# Patient Record
Sex: Female | Born: 1965
Health system: Southern US, Community
[De-identification: ages and names within clinical notes are randomized; demographics above are authoritative.]

## PROBLEM LIST (undated history)

## (undated) DIAGNOSIS — E669 Obesity, unspecified: Secondary | ICD-10-CM

## (undated) DIAGNOSIS — J45909 Unspecified asthma, uncomplicated: Secondary | ICD-10-CM

## (undated) DIAGNOSIS — E785 Hyperlipidemia, unspecified: Secondary | ICD-10-CM

## (undated) DIAGNOSIS — Z8709 Personal history of other diseases of the respiratory system: Secondary | ICD-10-CM

## (undated) DIAGNOSIS — I10 Essential (primary) hypertension: Secondary | ICD-10-CM

## (undated) DIAGNOSIS — E119 Type 2 diabetes mellitus without complications: Secondary | ICD-10-CM

## (undated) DIAGNOSIS — M1990 Unspecified osteoarthritis, unspecified site: Secondary | ICD-10-CM

## (undated) DIAGNOSIS — K219 Gastro-esophageal reflux disease without esophagitis: Secondary | ICD-10-CM

## (undated) DIAGNOSIS — D649 Anemia, unspecified: Secondary | ICD-10-CM

## (undated) HISTORY — DX: Unspecified asthma, uncomplicated: J45.909

## (undated) HISTORY — PX: CHOLECYSTECTOMY: SHX55

## (undated) HISTORY — PX: KNEE SURGERY: SHX244

## (undated) HISTORY — PX: SALPINGOOPHORECTOMY: SHX82

---

## 1998-11-04 HISTORY — PX: KNEE CARTILAGE SURGERY: SHX688

## 2016-01-19 ENCOUNTER — Emergency Department
Admission: EM | Admit: 2016-01-19 | Discharge: 2016-01-19 | Disposition: A | Discharge status: Home / Self Care | Payer: BLUE CROSS/BLUE SHIELD | Attending: Emergency Medicine | Admitting: Emergency Medicine

## 2016-01-19 DIAGNOSIS — J111 Influenza due to unidentified influenza virus with other respiratory manifestations: Secondary | ICD-10-CM | POA: Insufficient documentation

## 2016-01-19 LAB — RAPID INFLUENZA A&B ANTIGENS
Influenza A (ARMC): NEGATIVE
Influenza B (ARMC): NEGATIVE

## 2016-01-19 MED ORDER — AZITHROMYCIN 250 MG PO TABS: ORAL_TABLET | ORAL | Status: DC

## 2016-01-19 NOTE — ED Notes (Signed)
Patient reports flu like symptoms since Monday.  Patient awake, alert skin warm and dry, wnl.  No acute distress noted.

## 2016-01-19 NOTE — Discharge Instructions (Signed)

## 2016-01-20 NOTE — ED Provider Notes (Signed)
[facility] Emergency Department Provider Note ____________________________________________  Time seen: 2220  I have reviewed the triage vital signs and the nursing notes.  HISTORY  Chief Complaint  Influenza  HPI Kristin Cherry is a 50 y.o. female presents to the ED with her 59 year old son, both with complaints of flulike symptoms since Monday. Patient denies receiving the seasonal flu vaccine. She reports subjective fevers, cough, and congestion. She's been dosing Alka-Seltzer cough & cold without significant benefit to her symptoms.She rates her overall discomfort 5/10 in triage.  No past medical history on file.  There are no active problems to display for this patient.   No past surgical history on file.  Current Outpatient Rx  Name  Route  Sig  Dispense  Refill  . azithromycin (ZITHROMAX Z-PAK) 250 MG tablet      Take 2 tablets (500 mg) on  Day 1,  followed by 1 tablet (250 mg) once daily on Days 2 through 5.   6 each   0    Allergies Lodine and Penicillins  No family history on file.  Social History Social History  Substance Use Topics  . Smoking status: Not on file  . Smokeless tobacco: Not on file  . Alcohol Use: Not on file   Review of Systems  Constitutional: Positive for fever. Eyes: Negative for visual changes. ENT: Negative for sore throat. Cardiovascular: Negative for chest pain. Respiratory: Negative for shortness of breath. Reports intermittent cough. Gastrointestinal: Negative for abdominal pain, vomiting and diarrhea. Musculoskeletal: Negative for back pain. Skin: Negative for rash. Neurological: Negative for headaches, focal weakness or numbness. ____________________________________________  PHYSICAL EXAM:  VITAL SIGNS: ED Triage Vitals  Enc Vitals Group     BP 01/19/16 1946 171/93 mmHg     Pulse Rate 01/19/16 1946 67     Resp 01/19/16 1946 20     Temp 01/19/16 1946 98.1 F (36.7 C)     Temp Source 01/19/16  1946 Oral     SpO2 01/19/16 1946 99 %     Weight 01/19/16 1946 238 lb (107.956 kg)     Height 01/19/16 1946 5\' 6"  (1.676 m)     Head Cir --      Peak Flow --      Pain Score 01/19/16 1947 5     Pain Loc --      Pain Edu? --      Excl. in [place]? --    Constitutional: Alert and oriented. Well appearing and in no distress. Head: Normocephalic and atraumatic.      Eyes: Conjunctivae are normal. PERRL. Normal extraocular movements      Ears: Canals clear. TMs intact bilaterally.   Nose: No congestion/rhinorrhea.   Mouth/Throat: Mucous membranes are moist.   Neck: Supple. No thyromegaly. Hematological/Lymphatic/Immunological: No cervical lymphadenopathy. Cardiovascular: Normal rate, regular rhythm.  Respiratory: Normal respiratory effort. No wheezes/rales/rhonchi. Gastrointestinal: Soft and nontender. No distention. Musculoskeletal: Nontender with normal range of motion in all extremities.  Neurologic:  Normal gait without ataxia. Normal speech and language. No gross focal neurologic deficits are appreciated. Skin:  Skin is warm, dry and intact. No rash noted. Psychiatric: Mood and affect are normal. Patient exhibits appropriate insight and judgment. ____________________________________________   LABS (pertinent positives/negatives) Labs Reviewed  RAPID INFLUENZA A&B ANTIGENS ([place])  ____________________________________________  INITIAL IMPRESSION / ASSESSMENT AND PLAN / ED COURSE  Patient with a negative rapid flu test on evaluation today. She is presumed to have an influenza-like illness considering confirmation in her  son. She will be discharged with azithromycin for prophylactic treatment of a postviral pneumonia. She is encouraged to continue to monitor symptoms and treat fevers and symptoms as appropriate with over-the-counter medications. She will follow with primary care provider or [facility] as needed for ongoing symptom management. Return to the ED for acutely worsening  respiratory symptoms. ____________________________________________  FINAL CLINICAL IMPRESSION(S) / ED DIAGNOSES  Final diagnoses:  Influenza      Melvenia Needles, PA-C 01/20/16 0120  Orbie Pyo, MD 01/21/16 [phone]

## 2016-04-10 ENCOUNTER — Other Ambulatory Visit: Payer: Self-pay | Admitting: Nurse Practitioner

## 2016-04-10 DIAGNOSIS — Z1239 Encounter for other screening for malignant neoplasm of breast: Secondary | ICD-10-CM

## 2016-07-21 ENCOUNTER — Encounter: Payer: Self-pay | Admitting: Gynecology

## 2016-07-21 ENCOUNTER — Ambulatory Visit
Admission: EM | Admit: 2016-07-21 | Discharge: 2016-07-21 | Disposition: A | Discharge status: Home / Self Care | Payer: BLUE CROSS/BLUE SHIELD | Attending: Family Medicine | Admitting: Family Medicine

## 2016-07-21 DIAGNOSIS — J40 Bronchitis, not specified as acute or chronic: Secondary | ICD-10-CM | POA: Diagnosis not present

## 2016-07-21 HISTORY — DX: Essential (primary) hypertension: I10

## 2016-07-21 HISTORY — DX: Personal history of other diseases of the respiratory system: Z87.09

## 2016-07-21 HISTORY — DX: Type 2 diabetes mellitus without complications: E11.9

## 2016-07-21 MED ORDER — PREDNISONE 50 MG PO TABS
ORAL_TABLET | ORAL | 0 refills | Status: DC
Start: 2016-07-21 — End: 2017-05-09

## 2016-07-21 MED ORDER — DOXYCYCLINE HYCLATE 100 MG PO CAPS: 100.0000 mg | ORAL_CAPSULE | Freq: Two times a day (BID) | ORAL | 0 refills | Status: DC

## 2016-07-21 MED ORDER — HYDROCOD POLST-CPM POLST ER 10-8 MG/5ML PO SUER: 5.0000 mL | Freq: Two times a day (BID) | ORAL | 0 refills | Status: DC | PRN

## 2016-07-21 NOTE — ED Provider Notes (Signed)
[facility]    CSN: [id number] Arrival date & time: 07/21/16  1314  First Provider Contact:  First MD Initiated Contact with Patient 07/21/16 1351     History   Chief Complaint Chief Complaint  Patient presents with  . Cough  . Shortness of Breath   HPI  50 year old female with diabetes, hypertension, history of bronchitis per her report, tobacco abuse presents with complaints of cough, and shortness of breath.  Patient states she's been sick for the past 3 days. She has been experiencing severe cough and congestion. She reports associated shortness of breath. She also reports associated chest tightness. No known exacerbating or relieving factors. No associated fever or chills. Patient endorses a history of bronchitis. She also continues to smoke. Her history includes to likely underlying chronic bronchitis.  Past Medical History:  Diagnosis Date  . Diabetes mellitus without complication ([place])   . History of bronchitis   . Hypertension    Past Surgical History:  Procedure Laterality Date  . CHOLECYSTECTOMY    . KNEE SURGERY    . SALPINGOOPHORECTOMY Right    OB History    No data available     Home Medications    Prior to Admission medications   Medication Sig Start Date End Date Taking? Authorizing Provider  albuterol (PROVENTIL) (2.5 MG/3ML) 0.083% nebulizer solution Take 2.5 mg by nebulization every 6 (six) hours as needed for wheezing or shortness of breath.   Yes Historical Provider, MD  lisinopril (PRINIVIL,ZESTRIL) 10 MG tablet Take 10 mg by mouth daily.   Yes Historical Provider, MD  metFORMIN (GLUCOPHAGE) 1000 MG tablet Take 1,000 mg by mouth 2 (two) times daily with a meal.   Yes Historical Provider, MD  chlorpheniramine-HYDROcodone (TUSSIONEX PENNKINETIC ER) 10-8 MG/5ML SUER Take 5 mLs by mouth every 12 (twelve) hours as needed for cough. 07/21/16   Coral Spikes, DO  doxycycline (VIBRAMYCIN) 100 MG capsule Take 1 capsule (100 mg total) by mouth 2  (two) times daily. 07/21/16   Coral Spikes, DO  predniSONE (DELTASONE) 50 MG tablet 1 tablet daily x 5 days. 07/21/16   Coral Spikes, DO   Family History History reviewed. No pertinent family history.  Social History Social History  Substance Use Topics  . Smoking status: Current Every Day Smoker    Packs/day: 0.50    Types: Cigarettes  . Smokeless tobacco: Never Used  . Alcohol use Yes   Allergies   Lodine [etodolac] and Penicillins  Review of Systems Review of Systems  Constitutional: Negative for fever.  HENT: Positive for congestion.   Respiratory: Positive for cough, chest tightness and shortness of breath.   All other systems reviewed and are negative.  Physical Exam Triage Vital Signs ED Triage Vitals  Enc Vitals Group     BP 07/21/16 1339 (!) 156/99     Pulse Rate 07/21/16 1339 76     Resp 07/21/16 1339 18     Temp 07/21/16 1339 99.1 F (37.3 C)     Temp Source 07/21/16 1339 Oral     SpO2 07/21/16 1339 96 %     Weight 07/21/16 1342 230 lb (104.3 kg)     Height 07/21/16 1342 5\' 6"  (1.676 m)     Head Circumference --      Peak Flow --      Pain Score --      Pain Loc --      Pain Edu? --      Excl. in [place]? --  Updated Vital Signs BP (!) 156/99 (BP Location: Left Arm)   Pulse 76   Temp 99.1 F (37.3 C) (Oral)   Resp 18   Ht 5\' 6"  (1.676 m)   Wt 230 lb (104.3 kg)   LMP 06/23/2016   SpO2 96%   BMI 37.12 kg/m   Physical Exam  Constitutional: She is oriented to person, place, and time. She appears well-developed. No distress.  HENT:  Head: Normocephalic and atraumatic.  Mouth/Throat: Oropharynx is clear and moist.  Normal TM's bilaterally.   Eyes: Conjunctivae are normal.  Neck: Neck supple.  Cardiovascular: Normal rate and regular rhythm.   Pulmonary/Chest: Effort normal and breath sounds normal.  Abdominal: Soft. She exhibits no distension. There is no tenderness.  Musculoskeletal:  Left leg visibly smaller than the right (patient reports this  is from birth/polio).   Neurological: She is alert and oriented to person, place, and time.  Skin: Skin is warm and dry. No rash noted.  Psychiatric: She has a normal mood and affect.  Vitals reviewed.  UC Treatments / Results  Labs (all labs ordered are listed, but only abnormal results are displayed) Labs Reviewed - No data to display  EKG  EKG Interpretation None      Radiology No results found.  Procedures Procedures (including critical care time)  Medications Ordered in UC Medications - No data to display   Initial Impression / Assessment and Plan / UC Course  I have reviewed the triage vital signs and the nursing notes.  Pertinent labs & imaging results that were available during my care of the patient were reviewed by me and considered in my medical decision making (see chart for details).  50 year old female presents with symptoms consistent with bronchitis. I suspect underlying chronic bronchitis/COPD with exacerbation. Treating as such - prednisone, doxy, tussionex.   Final Clinical Impressions(s) / UC Diagnoses   Final diagnoses:  Bronchitis   New Prescriptions Discharge Medication List as of 07/21/2016  2:04 PM    START taking these medications   Details  chlorpheniramine-HYDROcodone (TUSSIONEX PENNKINETIC ER) 10-8 MG/5ML SUER Take 5 mLs by mouth every 12 (twelve) hours as needed for cough., Starting Sun 07/21/2016, Print    doxycycline (VIBRAMYCIN) 100 MG capsule Take 1 capsule (100 mg total) by mouth 2 (two) times daily., Starting Sun 07/21/2016, Print    predniSONE (DELTASONE) 50 MG tablet 1 tablet daily x 5 days., [place], DO 07/21/16 1412

## 2016-07-21 NOTE — ED Triage Notes (Signed)
Per patient has bronchitis in the past and her symptoms felt the same. Per patient symptoms coughing/ chest congestion associate with short of breath.

## 2016-07-21 NOTE — Discharge Instructions (Signed)
Take the medications as prescribed.  Quit smoking.  Follow up if you fail to improve or worsen.  Take care  Dr. Lacinda Axon

## 2017-05-09 ENCOUNTER — Emergency Department: Payer: BLUE CROSS/BLUE SHIELD

## 2017-05-09 ENCOUNTER — Emergency Department
Admission: EM | Admit: 2017-05-09 | Discharge: 2017-05-09 | Disposition: A | Discharge status: Home / Self Care | Payer: BLUE CROSS/BLUE SHIELD | Attending: Emergency Medicine | Admitting: Emergency Medicine

## 2017-05-09 DIAGNOSIS — F17210 Nicotine dependence, cigarettes, uncomplicated: Secondary | ICD-10-CM | POA: Insufficient documentation

## 2017-05-09 DIAGNOSIS — I10 Essential (primary) hypertension: Secondary | ICD-10-CM | POA: Insufficient documentation

## 2017-05-09 DIAGNOSIS — R079 Chest pain, unspecified: Secondary | ICD-10-CM

## 2017-05-09 DIAGNOSIS — Z79899 Other long term (current) drug therapy: Secondary | ICD-10-CM | POA: Insufficient documentation

## 2017-05-09 DIAGNOSIS — E119 Type 2 diabetes mellitus without complications: Secondary | ICD-10-CM | POA: Insufficient documentation

## 2017-05-09 DIAGNOSIS — Z7984 Long term (current) use of oral hypoglycemic drugs: Secondary | ICD-10-CM | POA: Insufficient documentation

## 2017-05-09 DIAGNOSIS — R42 Dizziness and giddiness: Secondary | ICD-10-CM | POA: Diagnosis present

## 2017-05-09 LAB — CBC
HEMATOCRIT: 42.9 % (ref 35.0–47.0)
HEMOGLOBIN: 14.6 g/dL (ref 12.0–16.0)
MCH: 30 pg (ref 26.0–34.0)
MCHC: 33.9 g/dL (ref 32.0–36.0)
MCV: 88.5 fL (ref 80.0–100.0)
Platelets: 275 10*3/uL (ref 150–440)
RBC: 4.85 MIL/uL (ref 3.80–5.20)
RDW: 13.6 % (ref 11.5–14.5)
WBC: 13.3 10*3/uL — ABNORMAL HIGH (ref 3.6–11.0)

## 2017-05-09 LAB — BASIC METABOLIC PANEL
ANION GAP: 8 (ref 5–15)
BUN: 10 mg/dL (ref 6–20)
CALCIUM: 9.7 mg/dL (ref 8.9–10.3)
CO2: 28 mmol/L (ref 22–32)
Chloride: 100 mmol/L — ABNORMAL LOW (ref 101–111)
Creatinine, Ser: 0.75 mg/dL (ref 0.44–1.00)
GFR calc Af Amer: >60 mL/min (ref >60)
GFR calc non Af Amer: >60 mL/min (ref >60)
GLUCOSE: 123 mg/dL — AB (ref 65–99)
POTASSIUM: 4 mmol/L (ref 3.5–5.1)
Sodium: 136 mmol/L (ref 135–145)

## 2017-05-09 LAB — TROPONIN I: Troponin I: <0.03 ng/mL (ref <0.03)

## 2017-05-09 NOTE — ED Triage Notes (Signed)
Pt states her PCP changes her medication tues, taking lisinopril/HCTZ and put back on metformin also started chantix . States today having mild chest pain radiating into the back with HA and b/p elevated today at home with nausea. Denies vomiting or diarrhea.Marland Kitchen

## 2017-05-09 NOTE — ED Provider Notes (Signed)
[facility] Emergency Department Provider Note  Time seen: 6:09 PM  I have reviewed the triage vital signs and the nursing notes.   HISTORY  Chief Complaint Dizziness    HPI Kristin Cherry is a 51 y.o. female with a past medical history of diabetes, hypertension, presents to the emergency department with nausea and intermittent back pain. According to the patient she been off all of her blood pressure and diabetic medications for the past 3 months. She saw her primary care doctor on Tuesday and was restarted on these medications which include lisinopril, hydrochlorothiazide, and metformin. In addition the patient was started on Chantix as she is trying to quit cigarettes. Patient states since starting the medication she has been experiencing intermittent nausea but denies any vomiting or diarrhea. States she has felt somewhat dizzy at times. Patient took her blood pressure tonight at home and was 140/95. She called her primary care doctor when they finally returned her call 1.5 hours later her blood pressure increased to 150/100, patient became concerned so she came to the emergency department for evaluation. Here the patient denies any symptoms currently. States she had some mild chest pain earlier today around 7:00 this morning when getting ready for work but it went away after several minutes. He has not had any since. Patient states her main concern was her blood pressure and making sure she did not have a heart attack.  Past Medical History:  Diagnosis Date  . Diabetes mellitus without complication ([place])   . History of bronchitis   . Hypertension     There are no active problems to display for this patient.   Past Surgical History:  Procedure Laterality Date  . CHOLECYSTECTOMY    . KNEE SURGERY    . SALPINGOOPHORECTOMY Right     Prior to Admission medications   Medication Sig Start Date End Date Taking? Authorizing Provider  albuterol (PROVENTIL) (2.5  MG/3ML) 0.083% nebulizer solution Take 2.5 mg by nebulization every 6 (six) hours as needed for wheezing or shortness of breath.    [provider]  chlorpheniramine-HYDROcodone (TUSSIONEX PENNKINETIC ER) 10-8 MG/5ML SUER Take 5 mLs by mouth every 12 (twelve) hours as needed for cough. 07/21/16   Coral Spikes, DO  doxycycline (VIBRAMYCIN) 100 MG capsule Take 1 capsule (100 mg total) by mouth 2 (two) times daily. 07/21/16   Coral Spikes, DO  lisinopril (PRINIVIL,ZESTRIL) 10 MG tablet Take 10 mg by mouth daily.    [provider]  metFORMIN (GLUCOPHAGE) 1000 MG tablet Take 1,000 mg by mouth 2 (two) times daily with a meal.    [provider]  predniSONE (DELTASONE) 50 MG tablet 1 tablet daily x 5 days. 07/21/16   Coral Spikes, DO    Allergies  Allergen Reactions  . Lodine [Etodolac] Hives  . Penicillins Hives    No family history on file.  Social History Social History  Substance Use Topics  . Smoking status: Current Every Day Smoker    Packs/day: 0.50    Types: Cigarettes  . Smokeless tobacco: Never Used  . Alcohol use Yes    Review of Systems Constitutional: Negative for fever. Cardiovascular: Chest pain around 7:00 this morning, now resolved Respiratory: Negative for shortness of breath. Gastrointestinal: Negative for abdominal pain. States intermittent nausea but denies vomiting or diarrhea Genitourinary: Negative for dysuria. Musculoskeletal: Intermittent upper back pain, denies any currently. Neurological: Negative for headache All other ROS negative  ____________________________________________   PHYSICAL EXAM:  VITAL  SIGNS: ED Triage Vitals  Enc Vitals Group     BP 05/09/17 1523 (!) 155/86     Pulse Rate 05/09/17 1523 84     Resp 05/09/17 1523 17     Temp 05/09/17 1523 98 F (36.7 C)     Temp Source 05/09/17 1523 Oral     SpO2 05/09/17 1523 97 %     Weight 05/09/17 1524 259 lb (117.5 kg)     Height 05/09/17 1524 5\' 6"  (1.676 m)      Head Circumference --      Peak Flow --      Pain Score 05/09/17 1523 5     Pain Loc --      Pain Edu? --      Excl. in [place]? --     Constitutional: Alert and oriented. Well appearing and in no distress. Eyes: Normal exam ENT   Head: Normocephalic and atraumatic.   Mouth/Throat: Mucous membranes are moist. Cardiovascular: Normal rate, regular rhythm. No murmur Respiratory: Normal respiratory effort without tachypnea nor retractions. Breath sounds are clear  Gastrointestinal: Soft and nontender. No distention.   Musculoskeletal: Nontender with normal range of motion in all extremities.  Neurologic:  Normal speech and language. No gross focal neurologic deficits Skin:  Skin is warm, dry and intact.  Psychiatric: Mood and affect are normal.   ____________________________________________    EKG  EKG reviewed and interpreted by myself shows normal sinus rhythm at 80 bpm, narrow QRS, normal axis, normal intervals, no concerning ST changes.  ____________________________________________    RADIOLOGY  Chest x-ray is negative  ____________________________________________   INITIAL IMPRESSION / ASSESSMENT AND PLAN / ED COURSE  Pertinent labs & imaging results that were available during my care of the patient were reviewed by me and considered in my medical decision making (see chart for details).  Patient presents to the emergency department with intermittent nausea for the past several days of starting her medications 4 days ago. Patient's labs in the emergency department are normal, troponin is negative, EKG is normal, chest x-ray is negative. Overall the patient appears very well. I suspect the nausea is likely due to the patient restarting her metformin after being off this medication for over 3 months. Patient is reassured by her workup, her blood pressures currently 140/85. Patient will be discharged home with PCP  follow-up.  ____________________________________________   FINAL CLINICAL IMPRESSION(S) / ED DIAGNOSES  Chest pain Hypertension    Harvest Dark, MD 05/09/17 [phone]

## 2017-05-09 NOTE — Discharge Instructions (Signed)
You have been seen in the emergency department today for high blood pressure and chest pain. Your workup has shown normal results. As we discussed please follow-up with your primary care physician in the next 1-2 days for recheck. Return to the emergency department for any further chest pain, trouble breathing, or any other symptom personally concerning to yourself.

## 2017-08-11 ENCOUNTER — Other Ambulatory Visit: Payer: Self-pay | Admitting: Family Medicine

## 2017-08-11 DIAGNOSIS — Z1231 Encounter for screening mammogram for malignant neoplasm of breast: Secondary | ICD-10-CM

## 2017-11-28 ENCOUNTER — Other Ambulatory Visit: Payer: Self-pay | Admitting: Family Medicine

## 2017-11-28 DIAGNOSIS — Z1239 Encounter for other screening for malignant neoplasm of breast: Secondary | ICD-10-CM

## 2017-12-25 ENCOUNTER — Encounter: Payer: Self-pay | Admitting: Student

## 2017-12-26 ENCOUNTER — Ambulatory Visit: Payer: BLUE CROSS/BLUE SHIELD | Admitting: Anesthesiology

## 2017-12-26 ENCOUNTER — Encounter
Admission: RE | Disposition: A | Discharge status: Home / Self Care | Payer: Self-pay | Source: Ambulatory Visit | Attending: Unknown Physician Specialty

## 2017-12-26 ENCOUNTER — Other Ambulatory Visit: Payer: Self-pay

## 2017-12-26 ENCOUNTER — Ambulatory Visit
Admission: RE | Admit: 2017-12-26 | Discharge: 2017-12-26 | Disposition: A | Discharge status: Home / Self Care | Payer: BLUE CROSS/BLUE SHIELD | Source: Ambulatory Visit | Attending: Unknown Physician Specialty | Admitting: Unknown Physician Specialty

## 2017-12-26 ENCOUNTER — Encounter: Payer: Self-pay | Admitting: Anesthesiology

## 2017-12-26 DIAGNOSIS — Z7984 Long term (current) use of oral hypoglycemic drugs: Secondary | ICD-10-CM | POA: Insufficient documentation

## 2017-12-26 DIAGNOSIS — K648 Other hemorrhoids: Secondary | ICD-10-CM | POA: Insufficient documentation

## 2017-12-26 DIAGNOSIS — E669 Obesity, unspecified: Secondary | ICD-10-CM | POA: Insufficient documentation

## 2017-12-26 DIAGNOSIS — Z6841 Body Mass Index (BMI) 40.0 and over, adult: Secondary | ICD-10-CM | POA: Diagnosis not present

## 2017-12-26 DIAGNOSIS — I10 Essential (primary) hypertension: Secondary | ICD-10-CM | POA: Diagnosis not present

## 2017-12-26 DIAGNOSIS — Z79899 Other long term (current) drug therapy: Secondary | ICD-10-CM | POA: Insufficient documentation

## 2017-12-26 DIAGNOSIS — E119 Type 2 diabetes mellitus without complications: Secondary | ICD-10-CM | POA: Diagnosis not present

## 2017-12-26 DIAGNOSIS — D125 Benign neoplasm of sigmoid colon: Secondary | ICD-10-CM | POA: Insufficient documentation

## 2017-12-26 DIAGNOSIS — K635 Polyp of colon: Secondary | ICD-10-CM | POA: Insufficient documentation

## 2017-12-26 DIAGNOSIS — F17210 Nicotine dependence, cigarettes, uncomplicated: Secondary | ICD-10-CM | POA: Diagnosis not present

## 2017-12-26 DIAGNOSIS — Z1211 Encounter for screening for malignant neoplasm of colon: Secondary | ICD-10-CM | POA: Diagnosis present

## 2017-12-26 HISTORY — DX: Hyperlipidemia, unspecified: E78.5

## 2017-12-26 HISTORY — DX: Obesity, unspecified: E66.9

## 2017-12-26 HISTORY — PX: COLONOSCOPY WITH PROPOFOL: SHX5780

## 2017-12-26 HISTORY — DX: Anemia, unspecified: D64.9

## 2017-12-26 LAB — GLUCOSE, CAPILLARY: Glucose-Capillary: 233 mg/dL — ABNORMAL HIGH (ref 65–99)

## 2017-12-26 SURGERY — COLONOSCOPY WITH PROPOFOL
Anesthesia: General

## 2017-12-26 MED ORDER — LIDOCAINE HCL (CARDIAC) 20 MG/ML IV SOLN
INTRAVENOUS | Status: DC | PRN
  Administered 2017-12-26: 30 mg via INTRAVENOUS

## 2017-12-26 MED ORDER — PROPOFOL 500 MG/50ML IV EMUL
INTRAVENOUS | Status: AC
  Filled 2017-12-26: qty 50

## 2017-12-26 MED ORDER — LACTATED RINGERS IV SOLN
INTRAVENOUS | Status: DC | PRN
  Administered 2017-12-26: 08:00:00 via INTRAVENOUS

## 2017-12-26 MED ORDER — SODIUM CHLORIDE 0.9 % IV SOLN
INTRAVENOUS | Status: DC
  Administered 2017-12-26: 08:00:00 via INTRAVENOUS

## 2017-12-26 MED ORDER — PROPOFOL 500 MG/50ML IV EMUL
INTRAVENOUS | Status: DC | PRN
  Administered 2017-12-26: 125 ug/kg/min via INTRAVENOUS

## 2017-12-26 MED ORDER — PROPOFOL 10 MG/ML IV BOLUS
INTRAVENOUS | Status: DC | PRN
  Administered 2017-12-26 (×2): 50 mg via INTRAVENOUS

## 2017-12-26 MED ORDER — SODIUM CHLORIDE 0.9 % IV SOLN: INTRAVENOUS | Status: DC

## 2017-12-26 NOTE — Transfer of Care (Signed)
Immediate Anesthesia Transfer of Care Note  Patient: Kristin Cherry  Procedure(s) Performed: COLONOSCOPY WITH PROPOFOL (N/A )  Patient Location: PACU and Endoscopy Unit  Anesthesia Type:General  Level of Consciousness: awake, alert  and oriented  Airway & Oxygen Therapy: Patient Spontanous Breathing  Post-op Assessment: Report given to RN and Post -op Vital signs reviewed and stable  Post vital signs: Reviewed and stable  Last Vitals:  Vitals:   12/26/17 0713 12/26/17 0820  BP: (!) 148/83 125/72  Pulse: 74 80  Resp: 18 16  Temp: (!) 36.4 C (!) 36.2 C  SpO2: 99% 98%    Last Pain:  Vitals:   12/26/17 0820  TempSrc: Tympanic      Patients Stated Pain Goal: 5 (51/02/58 5277)  Complications: No apparent anesthesia complications

## 2017-12-26 NOTE — Progress Notes (Signed)
Patient refused pregnancy test stated she knows she is not pregnant

## 2017-12-26 NOTE — Anesthesia Preprocedure Evaluation (Addendum)
Anesthesia Evaluation  Patient identified by MRN, date of birth, ID band Patient awake    Reviewed: Allergy & Precautions, NPO status , Patient's Chart, lab work & pertinent test results, reviewed documented beta blocker date and time   Airway Mallampati: III  TM Distance: >3 FB     Dental  (+) Chipped   Pulmonary Current Smoker,           Cardiovascular hypertension, Pt. on medications      Neuro/Psych    GI/Hepatic   Endo/Other  diabetes, Type 2  Renal/GU      Musculoskeletal   Abdominal   Peds  Hematology  (+) anemia ,   Anesthesia Other Findings Obese. She cannot urinate for a pregnancy test. She swears she is not pregnant. Will sign to that effect. Will not cancel -  Will proceed.  Reproductive/Obstetrics                            Anesthesia Physical Anesthesia Plan  ASA: III  Anesthesia Plan: General   Post-op Pain Management:    Induction: Intravenous  PONV Risk Score and Plan:   Airway Management Planned:   Additional Equipment:   Intra-op Plan:   Post-operative Plan:   Informed Consent: I have reviewed the patients History and Physical, chart, labs and discussed the procedure including the risks, benefits and alternatives for the proposed anesthesia with the patient or authorized representative who has indicated his/her understanding and acceptance.     Plan Discussed with: CRNA  Anesthesia Plan Comments:         Anesthesia Quick Evaluation

## 2017-12-26 NOTE — Anesthesia Postprocedure Evaluation (Signed)
Anesthesia Post Note  Patient: Kristin Cherry  Procedure(s) Performed: COLONOSCOPY WITH PROPOFOL (N/A )  Patient location during evaluation: Endoscopy Anesthesia Type: General Level of consciousness: awake and alert Pain management: pain level controlled Vital Signs Assessment: post-procedure vital signs reviewed and stable Respiratory status: spontaneous breathing, nonlabored ventilation, respiratory function stable and patient connected to nasal cannula oxygen Cardiovascular status: blood pressure returned to baseline and stable Postop Assessment: no apparent nausea or vomiting Anesthetic complications: no     Last Vitals:  Vitals:   12/26/17 0830 12/26/17 0840  BP: 125/75 122/85  Pulse: 75 70  Resp: (!) 23 16  Temp:    SpO2: 99% 100%    Last Pain:  Vitals:   12/26/17 0820  TempSrc: Tympanic                 Kimiya Brunelle S

## 2017-12-26 NOTE — Anesthesia Post-op Follow-up Note (Signed)
Anesthesia QCDR form completed.        

## 2017-12-26 NOTE — Op Note (Signed)
[facility] Gastroenterology Patient Name: Kristin Cherry Procedure Date: 12/26/2017 7:20 AM MRN: [id number] Account #: [id number] Date of Birth: [birth year] Admit Type: Outpatient Age: 52 Room: [facility] ENDO ROOM 1 Gender: Female Note Status: Finalized Procedure:            Colonoscopy Indications:          Screening for colorectal malignant neoplasm Providers:            Manya Silvas, MD Referring MD:         [place], MD (Referring MD) Medicines:            Propofol per Anesthesia Complications:        No immediate complications. Procedure:            Pre-Anesthesia Assessment:                       - After reviewing the risks and benefits, the patient                        was deemed in satisfactory condition to undergo the                        procedure.                       After obtaining informed consent, the colonoscope was                        passed under direct vision. Throughout the procedure,                        the patient's blood pressure, pulse, and oxygen                        saturations were monitored continuously. The                        Colonoscope was introduced through the anus and                        advanced to the the cecum, identified by appendiceal                        orifice and ileocecal valve. The colonoscopy was                        performed without difficulty. The patient tolerated the                        procedure well. The quality of the bowel preparation                        was excellent. Findings:      A small polyp was found in the sigmoid colon. The polyp was       pedunculated. The polyp was removed with a hot snare. Resection and       retrieval were complete.      A small polyp was found in the sigmoid colon. The polyp was sessile. The       polyp was removed with a hot snare. Resection and retrieval were  complete. To prevent bleeding after the polypectomy,  one hemostatic clip       was successfully placed. There was no bleeding during, or at the end, of       the procedure.      Internal hemorrhoids were found during endoscopy. The hemorrhoids were       small and Grade I (internal hemorrhoids that do not prolapse).      The exam was otherwise without abnormality. Impression:           - One small polyp in the sigmoid colon, removed with a                        hot snare. Resected and retrieved.                       - One small polyp in the sigmoid colon, removed with a                        hot snare. Resected and retrieved. Clip was placed.                       - Internal hemorrhoids.                       - The examination was otherwise normal. Recommendation:       - Await pathology results. Manya Silvas, MD 12/26/2017 8:21:21 AM This report has been signed electronically. Number of Addenda: 0 Note Initiated On: 12/26/2017 7:20 AM Scope Withdrawal Time: 0 hours 18 minutes 21 seconds  Total Procedure Duration: 0 hours 25 minutes 53 seconds       [facility]

## 2017-12-26 NOTE — H&P (Signed)
Primary Care Physician:  [facility], [facility] Primary Gastroenterologist:  Dr. Vira Agar  Pre-Procedure History & Physical: HPI:  Kristin Cherry is a 52 y.o. female is here for an colonoscopy.  This is for screening colonoscopy.   Past Medical History:  Diagnosis Date  . Anemia   . Diabetes mellitus without complication ([place])   . History of bronchitis   . Hyperlipidemia   . Hypertension   . Obesity     Past Surgical History:  Procedure Laterality Date  . CHOLECYSTECTOMY    . KNEE SURGERY    . SALPINGOOPHORECTOMY Right     Prior to Admission medications   Medication Sig Start Date End Date Taking? Authorizing Provider  albuterol (PROVENTIL) (2.5 MG/3ML) 0.083% nebulizer solution Take 2.5 mg by nebulization every 6 (six) hours as needed for wheezing or shortness of breath.   Yes [provider]  CHANTIX STARTING MONTH PAK 0.5 MG X 11 & 1 MG X 42 tablet Take 1 tablet by mouth as directed. 05/06/17  Yes [provider]  hydrochlorothiazide (MICROZIDE) 12.5 MG capsule Take 12.5 mg by mouth daily. 05/06/17  Yes [provider]  lisinopril (PRINIVIL,ZESTRIL) 10 MG tablet Take 10 mg by mouth daily.   Yes [provider]  metFORMIN (GLUCOPHAGE) 1000 MG tablet Take 1,000 mg by mouth 2 (two) times daily with a meal.   Yes [provider]  chlorpheniramine-HYDROcodone (TUSSIONEX PENNKINETIC ER) 10-8 MG/5ML SUER Take 5 mLs by mouth every 12 (twelve) hours as needed for cough. Patient not taking: Reported on 05/09/2017 07/21/16   Coral Spikes, DO  PROAIR HFA [phone] Base) MCG/ACT inhaler Inhale 1-2 puffs into the lungs every 6 (six) hours as needed for wheezing or shortness of breath. 05/08/17   [provider]    Allergies as of 10/20/2017 - Review Complete 05/09/2017  Allergen Reaction Noted  . Lodine [etodolac] Hives 01/19/2016  . Penicillins Hives 01/19/2016    History reviewed. No pertinent family history.  Social History    Socioeconomic History  . Marital status: Married    Spouse name: Not on file  . Number of children: Not on file  . Years of education: Not on file  . Highest education level: Not on file  Social Needs  . Financial resource strain: Not on file  . Food insecurity - worry: Not on file  . Food insecurity - inability: Not on file  . Transportation needs - medical: Not on file  . Transportation needs - non-medical: Not on file  Occupational History  . Not on file  Tobacco Use  . Smoking status: Current Every Day Smoker    Packs/day: 0.50    Types: Cigarettes, E-cigarettes  . Smokeless tobacco: Never Used  Substance and Sexual Activity  . Alcohol use: Yes    Comment: rarely  . Drug use: No  . Sexual activity: Not on file  Other Topics Concern  . Not on file  Social History Narrative  . Not on file    Review of Systems: See HPI, otherwise negative ROS  Physical Exam: BP (!) 148/83   Pulse 74   Temp (!) 97.5 F (36.4 C) (Tympanic)   Resp 18   Ht 5\' 6"  (1.676 m)   Wt 116.1 kg (256 lb)   LMP 12/15/2017   SpO2 99%   BMI 41.32 kg/m  General:   Alert,  pleasant and cooperative in NAD Head:  Normocephalic and atraumatic. Neck:  Supple; no masses or thyromegaly. Lungs:  Clear throughout to auscultation.    Heart:  Regular rate and rhythm. Abdomen:  Soft, nontender and nondistended. Normal bowel sounds, without guarding, and without rebound.   Neurologic:  Alert and  oriented x4;  grossly normal neurologically.  Impression/Plan: Kristin Cherry is here for an colonoscopy to be performed for screening colonoscopy.  Risks, benefits, limitations, and alternatives regarding  colonoscopy have been reviewed with the patient.  Questions have been answered.  All parties agreeable.   Gaylyn Cheers, MD  12/26/2017, 7:35 AM

## 2017-12-29 ENCOUNTER — Encounter: Payer: Self-pay | Admitting: Unknown Physician Specialty

## 2017-12-29 LAB — SURGICAL PATHOLOGY

## 2018-03-12 ENCOUNTER — Other Ambulatory Visit: Payer: Self-pay | Admitting: Nurse Practitioner

## 2018-03-12 DIAGNOSIS — Z1239 Encounter for other screening for malignant neoplasm of breast: Secondary | ICD-10-CM

## 2018-05-11 ENCOUNTER — Encounter: Payer: Self-pay | Admitting: Emergency Medicine

## 2018-05-11 ENCOUNTER — Emergency Department
Admission: EM | Admit: 2018-05-11 | Discharge: 2018-05-11 | Disposition: A | Discharge status: Home / Self Care | Payer: BLUE CROSS/BLUE SHIELD | Attending: Emergency Medicine | Admitting: Emergency Medicine

## 2018-05-11 DIAGNOSIS — Z7984 Long term (current) use of oral hypoglycemic drugs: Secondary | ICD-10-CM | POA: Insufficient documentation

## 2018-05-11 DIAGNOSIS — H9202 Otalgia, left ear: Secondary | ICD-10-CM | POA: Diagnosis present

## 2018-05-11 DIAGNOSIS — E119 Type 2 diabetes mellitus without complications: Secondary | ICD-10-CM | POA: Diagnosis not present

## 2018-05-11 DIAGNOSIS — I10 Essential (primary) hypertension: Secondary | ICD-10-CM | POA: Insufficient documentation

## 2018-05-11 DIAGNOSIS — F17210 Nicotine dependence, cigarettes, uncomplicated: Secondary | ICD-10-CM | POA: Insufficient documentation

## 2018-05-11 DIAGNOSIS — H6592 Unspecified nonsuppurative otitis media, left ear: Secondary | ICD-10-CM | POA: Diagnosis not present

## 2018-05-11 MED ORDER — CIPROFLOXACIN-HYDROCORTISONE 0.2-1 % OT SUSP: 3.0000 [drp] | Freq: Two times a day (BID) | OTIC | 0 refills | Status: AC

## 2018-05-11 MED ORDER — FLUTICASONE PROPIONATE 50 MCG/ACT NA SUSP: 1.0000 | Freq: Every day | NASAL | 2 refills | Status: DC

## 2018-05-11 MED ORDER — PREDNISONE 50 MG PO TABS: ORAL_TABLET | ORAL | 0 refills | Status: DC

## 2018-05-11 MED ORDER — PSEUDOEPHEDRINE HCL 30 MG PO TABS: 30.0000 mg | ORAL_TABLET | Freq: Four times a day (QID) | ORAL | 2 refills | Status: DC | PRN
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
## 2018-05-11 NOTE — Discharge Instructions (Signed)
Use Flonase and Sudafed for 1 week and conjunction with Cipro HC.  If ear pain has not improved after 1 week, start prednisone.
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
## 2018-08-11 ENCOUNTER — Other Ambulatory Visit: Payer: Self-pay | Admitting: Nurse Practitioner

## 2018-08-11 DIAGNOSIS — Z1231 Encounter for screening mammogram for malignant neoplasm of breast: Secondary | ICD-10-CM

## 2018-10-08 ENCOUNTER — Other Ambulatory Visit: Payer: Self-pay | Admitting: Nurse Practitioner

## 2018-10-08 DIAGNOSIS — M25512 Pain in left shoulder: Secondary | ICD-10-CM

## 2018-10-23 ENCOUNTER — Ambulatory Visit
Admission: RE | Admit: 2018-10-23 | Discharge: 2018-10-23 | Disposition: A | Discharge status: Home / Self Care | Payer: BLUE CROSS/BLUE SHIELD | Source: Ambulatory Visit | Attending: Nurse Practitioner | Admitting: Nurse Practitioner

## 2018-10-23 DIAGNOSIS — M25512 Pain in left shoulder: Secondary | ICD-10-CM | POA: Diagnosis present

## 2018-11-29 ENCOUNTER — Encounter: Payer: Self-pay | Admitting: Emergency Medicine

## 2018-11-29 ENCOUNTER — Other Ambulatory Visit: Payer: Self-pay

## 2018-11-29 ENCOUNTER — Ambulatory Visit
Admission: EM | Admit: 2018-11-29 | Discharge: 2018-11-29 | Disposition: A | Discharge status: Home / Self Care | Payer: BLUE CROSS/BLUE SHIELD | Attending: Family Medicine | Admitting: Family Medicine

## 2018-11-29 DIAGNOSIS — M5412 Radiculopathy, cervical region: Secondary | ICD-10-CM | POA: Insufficient documentation

## 2018-11-29 MED ORDER — KETOROLAC TROMETHAMINE 60 MG/2ML IM SOLN
60.0000 mg | Freq: Once | INTRAMUSCULAR | Status: AC
  Administered 2018-11-29: 60 mg via INTRAMUSCULAR

## 2018-11-29 MED ORDER — METAXALONE 800 MG PO TABS: 800.0000 mg | ORAL_TABLET | Freq: Three times a day (TID) | ORAL | 0 refills | Status: DC

## 2018-11-29 MED ORDER — DIAZEPAM 5 MG PO TABS: 5.0000 mg | ORAL_TABLET | Freq: Three times a day (TID) | ORAL | 0 refills | Status: DC

## 2018-11-29 MED ORDER — NAPROXEN 500 MG PO TABS: 500.0000 mg | ORAL_TABLET | Freq: Two times a day (BID) | ORAL | 0 refills | Status: DC

## 2018-11-29 NOTE — Discharge Instructions (Addendum)
Apply ice 20 minutes out of every 2 hours 4-5 times daily for comfort. Use  Caution while taking muscle relaxers.  Do not perform activities requiring concentration or judgment and do not drive.  °

## 2018-11-29 NOTE — ED Triage Notes (Signed)
Patient c/o right side shoulder spasm that started on Friday.
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
## 2018-12-07 DIAGNOSIS — M79675 Pain in left toe(s): Secondary | ICD-10-CM | POA: Diagnosis not present

## 2018-12-07 DIAGNOSIS — M65819 Other synovitis and tenosynovitis, unspecified shoulder: Secondary | ICD-10-CM | POA: Diagnosis not present

## 2018-12-18 DIAGNOSIS — M79672 Pain in left foot: Secondary | ICD-10-CM | POA: Diagnosis not present

## 2018-12-18 DIAGNOSIS — M898X9 Other specified disorders of bone, unspecified site: Secondary | ICD-10-CM | POA: Diagnosis not present

## 2018-12-18 DIAGNOSIS — M2042 Other hammer toe(s) (acquired), left foot: Secondary | ICD-10-CM | POA: Diagnosis not present

## 2018-12-21 DIAGNOSIS — Z6841 Body Mass Index (BMI) 40.0 and over, adult: Secondary | ICD-10-CM | POA: Insufficient documentation

## 2018-12-21 DIAGNOSIS — M7582 Other shoulder lesions, left shoulder: Secondary | ICD-10-CM | POA: Insufficient documentation

## 2018-12-21 DIAGNOSIS — M1711 Unilateral primary osteoarthritis, right knee: Secondary | ICD-10-CM | POA: Diagnosis not present

## 2018-12-21 DIAGNOSIS — M25561 Pain in right knee: Secondary | ICD-10-CM | POA: Diagnosis not present

## 2018-12-28 DIAGNOSIS — M7582 Other shoulder lesions, left shoulder: Secondary | ICD-10-CM | POA: Diagnosis not present

## 2019-02-10 DIAGNOSIS — B0239 Other herpes zoster eye disease: Secondary | ICD-10-CM | POA: Diagnosis not present

## 2019-02-17 DIAGNOSIS — B0239 Other herpes zoster eye disease: Secondary | ICD-10-CM | POA: Diagnosis not present

## 2019-05-03 ENCOUNTER — Encounter: Payer: Self-pay | Admitting: *Deleted

## 2019-05-03 ENCOUNTER — Other Ambulatory Visit: Payer: Self-pay

## 2019-05-03 ENCOUNTER — Emergency Department: Payer: BC Managed Care – PPO

## 2019-05-03 ENCOUNTER — Emergency Department
Admission: EM | Admit: 2019-05-03 | Discharge: 2019-05-03 | Disposition: A | Discharge status: Home / Self Care | Payer: BC Managed Care – PPO | Attending: Emergency Medicine | Admitting: Emergency Medicine

## 2019-05-03 DIAGNOSIS — Z79899 Other long term (current) drug therapy: Secondary | ICD-10-CM | POA: Insufficient documentation

## 2019-05-03 DIAGNOSIS — I10 Essential (primary) hypertension: Secondary | ICD-10-CM | POA: Diagnosis not present

## 2019-05-03 DIAGNOSIS — Z7984 Long term (current) use of oral hypoglycemic drugs: Secondary | ICD-10-CM | POA: Diagnosis not present

## 2019-05-03 DIAGNOSIS — R109 Unspecified abdominal pain: Secondary | ICD-10-CM | POA: Diagnosis not present

## 2019-05-03 DIAGNOSIS — N3289 Other specified disorders of bladder: Secondary | ICD-10-CM | POA: Diagnosis not present

## 2019-05-03 DIAGNOSIS — F17210 Nicotine dependence, cigarettes, uncomplicated: Secondary | ICD-10-CM | POA: Diagnosis not present

## 2019-05-03 DIAGNOSIS — E119 Type 2 diabetes mellitus without complications: Secondary | ICD-10-CM | POA: Diagnosis not present

## 2019-05-03 DIAGNOSIS — R1031 Right lower quadrant pain: Secondary | ICD-10-CM | POA: Diagnosis not present

## 2019-05-03 LAB — URINALYSIS, COMPLETE (UACMP) WITH MICROSCOPIC
Bilirubin Urine: NEGATIVE
Glucose, UA: >=500 mg/dL — AB
Hgb urine dipstick: NEGATIVE
Ketones, ur: 5 mg/dL — AB
Leukocytes,Ua: NEGATIVE
Nitrite: NEGATIVE
Protein, ur: NEGATIVE mg/dL
Specific Gravity, Urine: 1.008 (ref 1.005–1.030)
pH: 6 (ref 5.0–8.0)

## 2019-05-03 LAB — CBC
HCT: 44.4 % (ref 36.0–46.0)
Hemoglobin: 15.2 g/dL — ABNORMAL HIGH (ref 12.0–15.0)
MCH: 29.7 pg (ref 26.0–34.0)
MCHC: 34.2 g/dL (ref 30.0–36.0)
MCV: 86.7 fL (ref 80.0–100.0)
Platelets: 222 10*3/uL (ref 150–400)
RBC: 5.12 MIL/uL — ABNORMAL HIGH (ref 3.87–5.11)
RDW: 11.6 % (ref 11.5–15.5)
WBC: 11.9 10*3/uL — ABNORMAL HIGH (ref 4.0–10.5)
nRBC: 0 % (ref 0.0–0.2)

## 2019-05-03 LAB — COMPREHENSIVE METABOLIC PANEL
ALT: 28 U/L (ref 0–44)
AST: 21 U/L (ref 15–41)
Albumin: 4.4 g/dL (ref 3.5–5.0)
Alkaline Phosphatase: 103 U/L (ref 38–126)
Anion gap: 11 (ref 5–15)
BUN: 12 mg/dL (ref 6–20)
CO2: 23 mmol/L (ref 22–32)
Calcium: 9.2 mg/dL (ref 8.9–10.3)
Chloride: 97 mmol/L — ABNORMAL LOW (ref 98–111)
Creatinine, Ser: 0.62 mg/dL (ref 0.44–1.00)
GFR calc Af Amer: >60 mL/min (ref >60)
GFR calc non Af Amer: >60 mL/min (ref >60)
Glucose, Bld: 331 mg/dL — ABNORMAL HIGH (ref 70–99)
Potassium: 3.9 mmol/L (ref 3.5–5.1)
Sodium: 131 mmol/L — ABNORMAL LOW (ref 135–145)
Total Bilirubin: 0.6 mg/dL (ref 0.3–1.2)
Total Protein: 7.8 g/dL (ref 6.5–8.1)

## 2019-05-03 MED ORDER — TRAMADOL HCL 50 MG PO TABS: 50.0000 mg | ORAL_TABLET | Freq: Four times a day (QID) | ORAL | 0 refills | Status: DC | PRN

## 2019-05-03 MED ORDER — IBUPROFEN 600 MG PO TABS: 600.0000 mg | ORAL_TABLET | Freq: Four times a day (QID) | ORAL | 0 refills | Status: DC | PRN

## 2019-05-03 NOTE — ED Provider Notes (Signed)
[facility] Emergency Department Provider Note ____________________________________________   First MD Initiated Contact with Patient 05/03/19 1947     (approximate)  I have reviewed the triage vital signs and the nursing notes.   HISTORY  Chief Complaint Flank Pain    HPI Kristin Cherry is a 53 y.o. female with PMH as noted below who presents with right flank pain over the last week, intermittent course, worse in the morning when she first gets up, and then resolving throughout the day.  It radiates around to her right groin area.  She has had a few episodes of nausea but no vomiting, and no associated urinary symptoms, vaginal bleeding or discharge, or change in her bowel movements.  She denies any prior history of this pain and she has not had any injury.  Past Medical History:  Diagnosis Date  . Anemia   . Diabetes mellitus without complication ([place])   . History of bronchitis   . Hyperlipidemia   . Hypertension   . Obesity     There are no active problems to display for this patient.   Past Surgical History:  Procedure Laterality Date  . CHOLECYSTECTOMY    . COLONOSCOPY WITH PROPOFOL N/A 12/26/2017   Procedure: COLONOSCOPY WITH PROPOFOL;  Surgeon: Manya Silvas, MD;  Location: [facility] ENDOSCOPY;  Service: Endoscopy;  Laterality: N/A;  . KNEE SURGERY    . SALPINGOOPHORECTOMY Right     Prior to Admission medications   Medication Sig Start Date End Date Taking? Authorizing Provider  albuterol (PROVENTIL) (2.5 MG/3ML) 0.083% nebulizer solution Take 2.5 mg by nebulization every 6 (six) hours as needed for wheezing or shortness of breath.    [provider]  CHANTIX STARTING MONTH PAK 0.5 MG X 11 & 1 MG X 42 tablet Take 1 tablet by mouth as directed. 05/06/17   [provider]  diazepam (VALIUM) 5 MG tablet Take 1 tablet (5 mg total) by mouth 3 (three) times daily. Muscle spasms. 11/29/18   Lorin Picket, PA-C  fluticasone  (FLONASE) 50 MCG/ACT nasal spray Place 1 spray into both nostrils daily for 7 days. 05/11/18 05/18/18  Lannie Fields, PA-C  hydrochlorothiazide (MICROZIDE) 12.5 MG capsule Take 12.5 mg by mouth daily. 05/06/17   [provider]  ibuprofen (ADVIL) 600 MG tablet Take 1 tablet (600 mg total) by mouth every 6 (six) hours as needed. 05/03/19   Arta Silence, MD  lisinopril (PRINIVIL,ZESTRIL) 10 MG tablet Take 10 mg by mouth daily.    [provider]  metaxalone (SKELAXIN) 800 MG tablet Take 1 tablet (800 mg total) by mouth 3 (three) times daily. Start taking only after completing Valium for muscle spasm. 11/29/18   Lorin Picket, PA-C  metFORMIN (GLUCOPHAGE) 1000 MG tablet Take 1,000 mg by mouth 2 (two) times daily with a meal.    [provider]  naproxen (NAPROSYN) 500 MG tablet Take 1 tablet (500 mg total) by mouth 2 (two) times daily with a meal. 11/29/18   Lorin Picket, PA-C  PROAIR HFA 108 [phone] Base) MCG/ACT inhaler Inhale 1-2 puffs into the lungs every 6 (six) hours as needed for wheezing or shortness of breath. 05/08/17   [provider]  traMADol (ULTRAM) 50 MG tablet Take 1 tablet (50 mg total) by mouth every 6 (six) hours as needed for up to 5 days for severe pain. 05/03/19 05/08/19  Arta Silence, MD    Allergies Lodine [etodolac] and Penicillins  No family history  on file.  Social History Social History   Tobacco Use  . Smoking status: Current Every Day Smoker    Packs/day: 0.50    Types: Cigarettes, E-cigarettes  . Smokeless tobacco: Never Used  Substance Use Topics  . Alcohol use: Not Currently    Comment: rarely  . Drug use: No    Review of Systems  Constitutional: No fever. Eyes: No redness. ENT: No sore throat. Cardiovascular: Denies chest pain. Respiratory: Denies shortness of breath. Gastrointestinal: No vomiting or diarrhea. Genitourinary: Negative for dysuria.  Musculoskeletal: Positive for back pain. Skin: Negative  for rash. Neurological: Negative for headache, focal weakness or numbness.   ____________________________________________   PHYSICAL EXAM:  VITAL SIGNS: ED Triage Vitals  Enc Vitals Group     BP 05/03/19 1807 (!) 165/93     Pulse Rate 05/03/19 1807 95     Resp 05/03/19 1807 20     Temp 05/03/19 1807 99.1 F (37.3 C)     Temp Source 05/03/19 1807 Oral     SpO2 05/03/19 1807 99 %     Weight --      Height --      Head Circumference --      Peak Flow --      Pain Score 05/03/19 1808 7     Pain Loc --      Pain Edu? --      Excl. in [place]? --     Constitutional: Alert and oriented. Well appearing and in no acute distress. Eyes: Conjunctivae are normal.  Head: Atraumatic. Nose: No congestion/rhinnorhea. Mouth/Throat: Mucous membranes are moist.   Neck: Normal range of motion.  Cardiovascular: Good peripheral circulation. Respiratory: Normal respiratory effort.  No retractions. Gastrointestinal: Soft and nontender. No distention.  Genitourinary: No CVA tenderness. Musculoskeletal: No lower extremity edema.  Extremities warm and well perfused.  No midline spinal tenderness.  Mild right lumbar paraspinal and lateral flank area tenderness. Neurologic:  Normal speech and language. No gross focal neurologic deficits are appreciated.  Skin:  Skin is warm and dry. No rash noted. Psychiatric: Mood and affect are normal. Speech and behavior are normal.  ____________________________________________   LABS (all labs ordered are listed, but only abnormal results are displayed)  Labs Reviewed  COMPREHENSIVE METABOLIC PANEL - Abnormal; Notable for the following components:      Result Value   Sodium 131 (*)    Chloride 97 (*)    Glucose, Bld 331 (*)    All other components within normal limits  CBC - Abnormal; Notable for the following components:   WBC 11.9 (*)    RBC 5.12 (*)    Hemoglobin 15.2 (*)    All other components within normal limits  URINALYSIS, COMPLETE (UACMP) WITH  MICROSCOPIC - Abnormal; Notable for the following components:   Color, Urine STRAW (*)    APPearance CLEAR (*)    Glucose, UA >=500 (*)    Ketones, ur 5 (*)    Bacteria, UA RARE (*)    All other components within normal limits   ____________________________________________  EKG   ____________________________________________  RADIOLOGY  CT abdomen: Possible inflammatory changes around bladder.  No other acute abnormality.  ____________________________________________   PROCEDURES  Procedure(s) performed: No  Procedures  Critical Care performed: No ____________________________________________   INITIAL IMPRESSION / ASSESSMENT AND PLAN / ED COURSE  Pertinent labs & imaging results that were available during my care of the patient were reviewed by me and considered in my medical decision making (see chart  for details).  53 year old female with PMH as noted above presents with intermittent right flank pain over the last week sometimes associated with nausea.  It is worse when she first gets up and starts moving around and then improves.  She denies any trauma.  She has no fever, vomiting, or urinary symptoms.  On exam the patient is very well-appearing and her vital signs are normal except for hypertension.  She has minimal right lumbar paraspinal tenderness but no midline tenderness, and no tenderness in the right lower quadrant.  Initial lab work-up from triage is unremarkable except for minimally elevated WBC count.  UA shows no significant findings except for elevated glucose.  The patient is on oral medications although she reports that her glucose has not been well controlled and she does not want to take any injectable insulin.  Overall I suspect most likely radicular type pain versus muscle strain, however differential also includes ureteral stone.  We will obtain a CT renal stone study.  There is no evidence of acute appendicitis or other acute intra-abdominal cause.   ----------------------------------------- 9:41 PM on 05/03/2019 -----------------------------------------  CT scan is negative except for mild bladder wall thickening which could be from cystitis.  However, the patient has no urinary symptoms and the UA shows no WBCs, leukocytes, or nitrites.  Therefore there is no clinical evidence for UTI.  The patient is stable for discharge home.  She has not required any pain medication while in the ED.  I have prescribed ibuprofen as well as tramadol for likely radicular pain or other benign musculoskeletal etiology.  Return precautions given, the patient expresses understanding.  She is stable for discharge home at this time. ____________________________________________   FINAL CLINICAL IMPRESSION(S) / ED DIAGNOSES  Final diagnoses:  Right flank pain      NEW MEDICATIONS STARTED DURING THIS VISIT:  New Prescriptions   IBUPROFEN (ADVIL) 600 MG TABLET    Take 1 tablet (600 mg total) by mouth every 6 (six) hours as needed.   TRAMADOL (ULTRAM) 50 MG TABLET    Take 1 tablet (50 mg total) by mouth every 6 (six) hours as needed for up to 5 days for severe pain.     Note:  This document was prepared using Dragon voice recognition software and may include unintentional dictation errors.   Arta Silence, MD 05/03/19 2142

## 2019-05-03 NOTE — ED Notes (Signed)
Patient transported to CT 

## 2019-05-03 NOTE — Discharge Instructions (Addendum)
Return to the ER for new, worsening, or persistent severe pain, weakness or numbness, fever, urinary symptoms, or any other new or worsening symptoms that concern you.  Follow-up with your regular doctor.  Take the ibuprofen as needed for pain, and you may take the tramadol if needed for more severe breakthrough pain.

## 2019-05-03 NOTE — ED Triage Notes (Addendum)
Pt has low back pain and  right side pain.  Pt reports nausea.  No hx kidney stones.  No known injury to back.  Denies urinary sx.  Pt alert.

## 2019-05-04 ENCOUNTER — Telehealth: Payer: Self-pay | Admitting: Emergency Medicine

## 2019-05-04 MED ORDER — IBUPROFEN 600 MG PO TABS: 600.0000 mg | ORAL_TABLET | Freq: Four times a day (QID) | ORAL | 0 refills | Status: DC | PRN

## 2019-05-04 MED ORDER — TRAMADOL HCL 50 MG PO TABS: 50.0000 mg | ORAL_TABLET | Freq: Four times a day (QID) | ORAL | 0 refills | Status: AC | PRN

## 2019-05-04 NOTE — Telephone Encounter (Signed)
I was informed by the patient's nurse that the prescriptions were sent to the wrong pharmacy.  [facility] uses [facility].  I am sending the medications prescribed previously by Dr. Cherylann Banas to the pharmacy of her choice.

## 2019-06-14 DIAGNOSIS — E1165 Type 2 diabetes mellitus with hyperglycemia: Secondary | ICD-10-CM | POA: Diagnosis not present

## 2019-06-14 DIAGNOSIS — Z1389 Encounter for screening for other disorder: Secondary | ICD-10-CM | POA: Diagnosis not present

## 2019-08-04 DIAGNOSIS — Z1329 Encounter for screening for other suspected endocrine disorder: Secondary | ICD-10-CM | POA: Diagnosis not present

## 2019-08-04 DIAGNOSIS — I10 Essential (primary) hypertension: Secondary | ICD-10-CM | POA: Diagnosis not present

## 2019-08-04 DIAGNOSIS — E1140 Type 2 diabetes mellitus with diabetic neuropathy, unspecified: Secondary | ICD-10-CM | POA: Diagnosis not present

## 2019-08-04 DIAGNOSIS — E782 Mixed hyperlipidemia: Secondary | ICD-10-CM | POA: Diagnosis not present

## 2019-08-04 DIAGNOSIS — E1121 Type 2 diabetes mellitus with diabetic nephropathy: Secondary | ICD-10-CM | POA: Diagnosis not present

## 2019-08-04 DIAGNOSIS — E1165 Type 2 diabetes mellitus with hyperglycemia: Secondary | ICD-10-CM | POA: Diagnosis not present

## 2019-08-04 DIAGNOSIS — F17209 Nicotine dependence, unspecified, with unspecified nicotine-induced disorders: Secondary | ICD-10-CM | POA: Diagnosis not present

## 2019-08-06 DIAGNOSIS — Z0000 Encounter for general adult medical examination without abnormal findings: Secondary | ICD-10-CM | POA: Diagnosis not present

## 2019-08-11 ENCOUNTER — Other Ambulatory Visit: Payer: Self-pay | Admitting: Family Medicine

## 2019-08-11 DIAGNOSIS — Z13820 Encounter for screening for osteoporosis: Secondary | ICD-10-CM

## 2020-02-12 ENCOUNTER — Ambulatory Visit: Payer: BC Managed Care – PPO | Attending: Internal Medicine

## 2020-02-12 DIAGNOSIS — Z23 Encounter for immunization: Secondary | ICD-10-CM

## 2020-02-12 NOTE — Progress Notes (Signed)
   Covid-19 Vaccination Clinic  Name:  Kristin Cherry    MRN: [id number] DOB: April 20, 1966  02/12/2020  [name] was observed post Covid-19 immunization for 15 minutes without incident. She was provided with Vaccine Information Sheet and instruction to access the V-Safe system.   [name] was instructed to call 911 with any severe reactions post vaccine: Marland Kitchen Difficulty breathing  . Swelling of face and throat  . A fast heartbeat  . A bad rash all over body  . Dizziness and weakness   Immunizations Administered    Name Date Dose VIS Date Route   Pfizer COVID-19 Vaccine 02/12/2020 10:48 AM 0.3 mL 10/15/2019 Intramuscular   Manufacturer: [place]   Lot: [id number]   Sisters: [id number]

## 2020-03-04 ENCOUNTER — Ambulatory Visit
Admission: RE | Admit: 2020-03-04 | Discharge: 2020-03-04 | Disposition: A | Discharge status: Home / Self Care | Payer: BC Managed Care – PPO | Source: Ambulatory Visit | Attending: Emergency Medicine | Admitting: Emergency Medicine

## 2020-03-04 ENCOUNTER — Ambulatory Visit
Admission: EM | Admit: 2020-03-04 | Discharge: 2020-03-04 | Disposition: A | Discharge status: Home / Self Care | Payer: BC Managed Care – PPO | Source: Home / Self Care | Attending: Family Medicine | Admitting: Family Medicine

## 2020-03-04 ENCOUNTER — Other Ambulatory Visit: Payer: Self-pay

## 2020-03-04 ENCOUNTER — Encounter: Payer: Self-pay | Admitting: Emergency Medicine

## 2020-03-04 ENCOUNTER — Telehealth: Payer: Self-pay | Admitting: Emergency Medicine

## 2020-03-04 DIAGNOSIS — M79662 Pain in left lower leg: Secondary | ICD-10-CM | POA: Insufficient documentation

## 2020-03-04 DIAGNOSIS — M7989 Other specified soft tissue disorders: Secondary | ICD-10-CM | POA: Insufficient documentation

## 2020-03-04 DIAGNOSIS — T148XXA Other injury of unspecified body region, initial encounter: Secondary | ICD-10-CM

## 2020-03-04 DIAGNOSIS — M79605 Pain in left leg: Secondary | ICD-10-CM

## 2020-03-04 MED ORDER — METHOCARBAMOL 750 MG PO TABS: 750.0000 mg | ORAL_TABLET | Freq: Four times a day (QID) | ORAL | 0 refills | Status: DC

## 2020-03-04 MED ORDER — MELOXICAM 15 MG PO TABS: 15.0000 mg | ORAL_TABLET | Freq: Every day | ORAL | 0 refills | Status: DC

## 2020-03-04 NOTE — Telephone Encounter (Signed)
Tried to call pt per Glenard Haring to let pt know that she does not need to take naproxen and Mobic at the same time. Can take the naproxen when the Mobic is finished. Pt did not answer and VM not set up yet.

## 2020-03-04 NOTE — Discharge Instructions (Signed)
If your ultrasound is negative, try to avoid symptoms much as possible by limiting sitting lifting or bending.  Use ice on your calf for comfort.  If you continue to have pain in several days despite the use of the medications and follow-up with your primary care physician.Apply ice 20 minutes out of every 2 hours 4-5 times daily for comfort. Use  Caution while taking muscle relaxers.  Do not perform activities requiring concentration or judgment and do not drive.

## 2020-03-04 NOTE — ED Provider Notes (Signed)
[facility]    CSN: [id number] Arrival date & time: 03/04/20  0847      History   Chief Complaint Chief Complaint  Patient presents with  . Leg Pain    left    HPI Kristin Cherry is a 54 y.o. female.   HPI  54 year old female presents with pain in the back of her left leg indicating the calf started suddenly last night.  Denies any injury of falling.  She does relate that she had spent the day bending over at the waist taking out flower bulbs.  He did not kneel or squat or sit.  She states she does not have back pain.  She is is relating that whenever she walks the pain is worse.  He did take 1 tramadol last night and the pain is somewhat better today.  Feels that her left leg is swollen more than usual.  He has a great deal of atrophy due to previous polio as a child.  Therefore measurements are unreliable.  She did have a spasm in her back that lasted for a short period of time and also relates that she had groin pain anteriorly also for a short period of time.  Job requires her to sit for prolonged periods.        Past Medical History:  Diagnosis Date  . Anemia   . Diabetes mellitus without complication ([place])   . History of bronchitis   . Hyperlipidemia   . Hypertension   . Obesity     There are no problems to display for this patient.   Past Surgical History:  Procedure Laterality Date  . CHOLECYSTECTOMY    . COLONOSCOPY WITH PROPOFOL N/A 12/26/2017   Procedure: COLONOSCOPY WITH PROPOFOL;  Surgeon: Manya Silvas, MD;  Location: [facility] ENDOSCOPY;  Service: Endoscopy;  Laterality: N/A;  . KNEE SURGERY    . SALPINGOOPHORECTOMY Right     OB History   No obstetric history on file.      Home Medications    Prior to Admission medications   Medication Sig Start Date End Date Taking? Authorizing Provider  fluticasone (FLONASE) 50 MCG/ACT nasal spray Place 1 spray into both nostrils daily for 7 days. 05/11/18 03/04/20 Yes Vallarie Mare M, PA-C    gabapentin (NEURONTIN) 100 MG capsule  02/27/20  Yes [provider]  hydrochlorothiazide (MICROZIDE) 12.5 MG capsule Take 12.5 mg by mouth daily. 05/06/17  Yes [provider]  ibuprofen (ADVIL) 600 MG tablet Take 1 tablet (600 mg total) by mouth every 6 (six) hours as needed for moderate pain. Take with meals if possible. 05/04/19  Yes Hinda Kehr, MD  JANUMET XR 50-1000 MG TB24 Take 2 tablets by mouth daily. 11/30/19  Yes [provider]  lisinopril (PRINIVIL,ZESTRIL) 10 MG tablet Take 10 mg by mouth daily.   Yes [provider]  OZEMPIC, 1 MG/DOSE, 2 MG/1.5ML SOPN INJECT 1MG  DOSE SUBCUTANEOUSLY WEEKLY. 02/09/20  Yes [provider]  albuterol (PROVENTIL) (2.5 MG/3ML) 0.083% nebulizer solution Take 2.5 mg by nebulization every 6 (six) hours as needed for wheezing or shortness of breath.    [provider]  meloxicam (MOBIC) 15 MG tablet Take 1 tablet (15 mg total) by mouth daily. Take with food. 03/04/20   Lorin Picket, PA-C  methocarbamol (ROBAXIN) 750 MG tablet Take 1 tablet (750 mg total) by mouth 4 (four) times daily. 03/04/20   Lorin Picket, PA-C  naproxen (NAPROSYN) 500 MG tablet Take 1 tablet (500  mg total) by mouth 2 (two) times daily with a meal. 11/29/18   Lorin Picket, PA-C  metFORMIN (GLUCOPHAGE) 1000 MG tablet Take 1,000 mg by mouth 2 (two) times daily with a meal.  03/04/20  [provider]    Family History History reviewed. No pertinent family history.  Social History Social History   Tobacco Use  . Smoking status: Current Every Day Smoker    Packs/day: 0.50    Types: Cigarettes, E-cigarettes  . Smokeless tobacco: Never Used  Substance Use Topics  . Alcohol use: Not Currently    Comment: rarely  . Drug use: No     Allergies   Lodine [etodolac] and Penicillins   Review of Systems Review of Systems  Constitutional: Positive for activity change. Negative for appetite change, chills, diaphoresis,  fatigue and fever.  Musculoskeletal: Positive for myalgias.  All other systems reviewed and are negative.    Physical Exam Triage Vital Signs ED Triage Vitals  Enc Vitals Group     BP 03/04/20 0901 (!) 166/91     Pulse Rate 03/04/20 0901 79     Resp 03/04/20 0901 14     Temp 03/04/20 0901 98.1 F (36.7 C)     Temp Source 03/04/20 0901 Oral     SpO2 03/04/20 0901 99 %     Weight 03/04/20 0859 245 lb (111.1 kg)     Height 03/04/20 0859 5\' 6"  (1.676 m)     Head Circumference --      Peak Flow --      Pain Score 03/04/20 0858 8     Pain Loc --      Pain Edu? --      Excl. in [place]? --    No data found.  Updated Vital Signs BP (!) 166/91 (BP Location: Left Arm)   Pulse 79   Temp 98.1 F (36.7 C) (Oral)   Resp 14   Ht 5\' 6"  (1.676 m)   Wt 245 lb (111.1 kg)   LMP 10/02/2018 (Exact Date)   SpO2 99%   BMI 39.54 kg/m   Visual Acuity Right Eye Distance:   Left Eye Distance:   Bilateral Distance:    Right Eye Near:   Left Eye Near:    Bilateral Near:     Physical Exam Vitals and nursing note reviewed.  Constitutional:      General: She is not in acute distress.    Appearance: Normal appearance. She is not ill-appearing or toxic-appearing.  HENT:     Head: Normocephalic and atraumatic.  Musculoskeletal:        General: Tenderness and signs of injury present.     Comments: Examination of the lumbar spine shows a level pelvis in stance.  She has no tenderness along the paraspinous muscles over the sciatic joints or in the sciatic notch.  He has a antalgic gait present.  Her left lower extremity is significant for atrophy of the calf muscle due to previous polio.  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  Sensation is intact to light touch throughout the lower extremities.  EHL and anterior tibialis are strong to clinical resistance.  Peroneal is unable to be tested due to previous polio .  She is tender along the posterior calf as well as long the  lateral knee.  Knee has good range of motion.  Skin:    General: Skin is warm and dry.  Neurological:     General: No  focal deficit present.     Mental Status: She is alert and oriented to person, place, and time.  Psychiatric:        Mood and Affect: Mood normal.        Behavior: Behavior normal.        Thought Content: Thought content normal.        Judgment: Judgment normal.      UC Treatments / Results  Labs (all labs ordered are listed, but only abnormal results are displayed) Labs Reviewed - No data to display  EKG   Radiology US Venous Img Lower Unilateral Left (DVT)  Result Date: 03/04/2020 CLINICAL DATA:  Left lower leg swelling and pain. EXAM: LEFT LOWER EXTREMITY VENOUS DOPPLER ULTRASOUND TECHNIQUE: Gray-scale sonography with compression, as well as color and duplex ultrasound, were performed to evaluate the deep venous system(s) from the level of the common femoral vein through the popliteal and proximal calf veins. COMPARISON:  None. FINDINGS: VENOUS Normal compressibility of the common femoral, superficial femoral, and popliteal veins, as well as the visualized calf veins. Visualized portions of profunda femoral vein and great saphenous vein unremarkable. No filling defects to suggest DVT on grayscale or color Doppler imaging. Doppler waveforms show normal direction of venous flow, normal respiratory plasticity and response to augmentation. Limited views of the contralateral common femoral vein are unremarkable. OTHER None. Limitations: none IMPRESSION: Negative. Electronically Signed   By: Marin Olp M.D.   On: 03/04/2020 11:06    Procedures Procedures (including critical care time)  Medications Ordered in UC Medications - No data to display  Initial Impression / Assessment and Plan / UC Course  I have reviewed the triage vital signs and the nursing notes.  Pertinent labs & imaging results that were available during my care of the patient were reviewed by me and  considered in my medical decision making (see chart for details).   54 year old female presents today with sudden onset of left lower leg cramping and pain in her calf that started yesterday.  She presented a history of bending over planting flower bulbs for 20 minutes.  Afterwards she began to have the left calf pain.  Because of previous polio as a child she had significant atrophy of the left calf making measurements unreliable.  Because of the tenderness that she had and her history of sitting for prolonged periods of time at work I felt that venous Doppler ultrasound indicated to rule out a possible DVT.  She was sent to [facility] ultrasound for the study.  I received word and confirmation that the ultrasound was normal.  Because of this most likely that she has had strain of the muscle or perhaps even a low back's strain with radicular symptoms.  Physical exam was negative for neurological involvement.  I spoke to her by phone when she was in the ultrasound department relayed the findings to her.  Already called in meloxicam and Robaxin.  She was given appropriate precautions for the use of the Robaxin.  She was told to  avoid symptoms much as possible by limiting sitting lifting or bending.  She was using ice on her calf for comfort.  If she continued to have pain for several days despite the use of the medications that she should follow-up with her primary care physician or return to our clinic.   Final Clinical Impressions(s) / UC Diagnoses   Final diagnoses:  Left leg pain  Muscle strain     Discharge Instructions  If your ultrasound is negative, try to avoid symptoms much as possible by limiting sitting lifting or bending.  Use ice on your calf for comfort.  If you continue to have pain in several days despite the use of the medications and follow-up with your primary care physician.Apply ice 20 minutes out of every 2 hours 4-5 times daily for comfort. Use  Caution while taking muscle  relaxers.  Do not perform activities requiring concentration or judgment and do not drive.     ED Prescriptions    Medication Sig Dispense Auth. Provider   methocarbamol (ROBAXIN) 750 MG tablet Take 1 tablet (750 mg total) by mouth 4 (four) times daily. 21 tablet Crecencio Mc P, PA-C   meloxicam (MOBIC) 15 MG tablet Take 1 tablet (15 mg total) by mouth daily. Take with food. 30 tablet Lorin Picket, PA-C     PDMP not reviewed this encounter.   Lorin Picket, PA-C 03/04/20 [phone]

## 2020-03-04 NOTE — ED Triage Notes (Signed)
Patient c/o pain in the back of her left leg that started last night.  Patient denies injury or falling.

## 2020-03-06 NOTE — Progress Notes (Signed)
New patient visit   Patient: Kristin Cherry   DOB: Sep 23, 1966   54 y.o. Female  MRN: [id number] Visit Date: 03/07/2020  Today's healthcare provider: Marcille Buffy, FNP   Chief Complaint  Patient presents with  . New Patient (Initial Visit)   Subjective    Kristin Cherry is a 54 y.o. female who presents today as a new patient to establish care.  HPI  Patient comes to our office today from [facility] to establish care. Patient stats that she feels well today but would like to address concern of "knot/mass" found in her right breast, patient states that it has ben present for one month and states that she notices skin is sore to touch after removing bra.   Patient reports that she follows a well balanced meal but is not actively exercising, sleep is fair.   Last Colonoscopy 12/26/17 ( 2 small polyps in the sigmoid colon were removed, internal hemorrhoids seen). Repeat in 3years for colonoscopy. She saw Dr. Keith Rake  Last Mammogram and BMD ordered none in 5 years. She reports previous in the past. Denies any breast procedures or surgeries. . DUE.  Pap Exam- No documentation in chart.Patient reports 2020 and states that pap was normal - will get records request. She had this at [facility].  She has not had a period in since December 2019. Denies any bleeding or spotting. Denies any pain.   She has had hair thinning on top of he head, she reports over one year ago after menopause.   She has had redness and shininess to her skin in her left lower leg and sometimes in right lower leg on an off for 4 to 5 years.She has pain in her lower extremities. Ultrasound was done at urgent care as well.  She denies any dizziness lightheadedness presyncopal or syncopal episodes.  She does take Lomotil for diarrhea occasionally.  Denies any rectal bleeding or rectal pain. Reviewed imaging from 03/04/2020. COMPARISON:  None.  FINDINGS: VENOUS  Normal compressibility of the common  femoral, superficial femoral, and popliteal veins, as well as the visualized calf veins. Visualized portions of profunda femoral vein and great saphenous vein unremarkable. No filling defects to suggest DVT on grayscale or color Doppler imaging. Doppler waveforms show normal direction of venous flow, normal respiratory plasticity and response to augmentation.  Limited views of the contralateral common femoral vein are unremarkable.  OTHER  None.  Limitations: none  IMPRESSION: Negative.   Electronically Signed   By: Marin Olp M.D.   On: 03/04/2020 11:06   She is also diabetic and on Ozempic and Janumet. Reviewed on medication list dosages.   She is on Robaxin for muscle starin lumbar sacral after pulling weeds and was given Robaxin and reports it is resolved now. No paresthesias, saddle or groin numbness. Denies any loss of bowel or bladder control.    Blood pressure HCTZ doing well usually normal reading at home.  She is nervous today she reports she will keep log.  Patient  denies any fever, body aches,chills, rash, chest pain, shortness of breath, nausea, vomiting, or diarrhea.   Past Medical History:  Diagnosis Date  . Anemia   . Asthma   . Diabetes mellitus without complication ([place])   . History of bronchitis   . Hyperlipidemia   . Hypertension   . Obesity    Past Surgical History:  Procedure Laterality Date  . CHOLECYSTECTOMY    . COLONOSCOPY WITH PROPOFOL N/A 12/26/2017  Procedure: COLONOSCOPY WITH PROPOFOL;  Surgeon: Manya Silvas, MD;  Location: [facility] ENDOSCOPY;  Service: Endoscopy;  Laterality: N/A;  . KNEE SURGERY     slow the growth of right knee patient reports in 1979 and left knee cartilage repair in 2000  . SALPINGOOPHORECTOMY Right    Family Status  Relation Name Status  . Mother  Deceased  . Father  Deceased  . Sister  (Not Specified)  . Brother  (Not Specified)  . MGM  (Not Specified)  . PGM  (Not Specified)  . PGF  (Not  Specified)  . Sister  (Not Specified)   Family History  Problem Relation Age of Onset  . Cancer Mother   . Hypertension Mother   . Hypertension Father   . Cancer - Other Father   . Congestive Heart Failure Father   . Cancer Sister   . Diabetes Sister   . Hypertension Sister   . Cancer Brother   . Hypertension Brother   . Diabetes Maternal Grandmother   . Congestive Heart Failure Paternal Grandmother   . Hypertension Paternal Grandfather   . Diabetes Sister    Social History   Socioeconomic History  . Marital status: Single    Spouse name: Not on file  . Number of children: Not on file  . Years of education: Not on file  . Highest education level: Not on file  Occupational History  . Not on file  Tobacco Use  . Smoking status: Current Every Day Smoker    Packs/day: 0.50    Types: Cigarettes, E-cigarettes  . Smokeless tobacco: Never Used  Substance and Sexual Activity  . Alcohol use: Not Currently    Comment: rarely  . Drug use: No  . Sexual activity: Not on file  Other Topics Concern  . Not on file  Social History Narrative  . Not on file   Social Determinants of Health   Financial Resource Strain:   . Difficulty of Paying Living Expenses:   Food Insecurity:   . Worried About Charity fundraiser in the Last Year:   . Arboriculturist in the Last Year:   Transportation Needs:   . Film/video editor (Medical):   Marland Kitchen Lack of Transportation (Non-Medical):   Physical Activity:   . Days of Exercise per Week:   . Minutes of Exercise per Session:   Stress:   . Feeling of Stress :   Social Connections:   . Frequency of Communication with Friends and Family:   . Frequency of Social Gatherings with Friends and Family:   . Attends Religious Services:   . Active Member of Clubs or Organizations:   . Attends Archivist Meetings:   Marland Kitchen Marital Status:    Outpatient Medications Prior to Visit  Medication Sig  . albuterol (PROVENTIL) (2.5 MG/3ML) 0.083%  nebulizer solution Take 2.5 mg by nebulization every 6 (six) hours as needed for wheezing or shortness of breath.  . gabapentin (NEURONTIN) 100 MG capsule   . hydrochlorothiazide (HYDRODIURIL) 25 MG tablet Take 25 mg by mouth daily.  Marland Kitchen JANUMET XR 50-1000 MG TB24 Take 2 tablets by mouth daily.  Marland Kitchen lisinopril (ZESTRIL) 20 MG tablet Take 20 mg by mouth daily.  . meloxicam (MOBIC) 15 MG tablet Take 1 tablet (15 mg total) by mouth daily. Take with food.  . methocarbamol (ROBAXIN) 750 MG tablet Take 1 tablet (750 mg total) by mouth 4 (four) times daily.  Marland Kitchen OZEMPIC, 1 MG/DOSE, 2 MG/1.5ML SOPN  INJECT 1MG  DOSE SUBCUTANEOUSLY WEEKLY.  . fluticasone (FLONASE) 50 MCG/ACT nasal spray Place 1 spray into both nostrils daily for 7 days.  . [DISCONTINUED] hydrochlorothiazide (MICROZIDE) 12.5 MG capsule Take 12.5 mg by mouth daily.  . [DISCONTINUED] ibuprofen (ADVIL) 600 MG tablet Take 1 tablet (600 mg total) by mouth every 6 (six) hours as needed for moderate pain. Take with meals if possible.  . [DISCONTINUED] lisinopril (PRINIVIL,ZESTRIL) 10 MG tablet Take 10 mg by mouth daily.  . [DISCONTINUED] naproxen (NAPROSYN) 500 MG tablet Take 1 tablet (500 mg total) by mouth 2 (two) times daily with a meal.   No facility-administered medications prior to visit.   Allergies  Allergen Reactions  . Lodine [Etodolac] Hives  . Penicillins Hives    Immunization History  Administered Date(s) Administered  . PFIZER SARS-COV-2 Vaccination 02/12/2020    Health Maintenance  Topic Date Due  . HIV Screening  Never done  . TETANUS/TDAP  Never done  . PAP SMEAR-Modifier  Never done  . MAMMOGRAM  Never done  . COVID-19 Vaccine (2 - Pfizer 2-dose series) 03/04/2020  . INFLUENZA VACCINE  06/04/2020  . COLONOSCOPY  12/27/2027    Patient Care Team: Doreen Beam, FNP as PCP - General (Family Medicine)  Review of Systems  Constitutional: Negative for activity change, appetite change, chills, diaphoresis, fatigue,  fever and unexpected weight change.  HENT: Positive for rhinorrhea, sinus pressure and sneezing. Negative for congestion, dental problem, drooling, ear discharge, ear pain, facial swelling, hearing loss, mouth sores, nosebleeds, postnasal drip, sinus pain, sore throat, tinnitus, trouble swallowing and voice change.        Reports seasonal allergies.   Eyes: Negative.   Respiratory: Negative for apnea, cough, choking, chest tightness, shortness of breath, wheezing and stridor.   Cardiovascular: Positive for leg swelling (left and right lower leg. x 1 year. mild ). Negative for chest pain and palpitations.  Gastrointestinal: Positive for diarrhea (only prn denies any bleeding. ). Negative for abdominal distention, abdominal pain, anal bleeding, blood in stool, constipation, nausea, rectal pain and vomiting.  Endocrine: Negative for polydipsia, polyphagia and polyuria.       Hair loss for over a year. Denies any itching, scalp fungus or scalp pain. She wears wigs.   Genitourinary: Negative.   Musculoskeletal: Positive for gait problem and myalgias. Negative for arthralgias, back pain, joint swelling, neck pain and neck stiffness.  Skin: Negative.   Allergic/Immunologic: Positive for environmental allergies. Negative for food allergies and immunocompromised state.  Neurological: Positive for headaches. Negative for dizziness, tremors, seizures, syncope, facial asymmetry, speech difficulty, weakness, light-headedness and numbness.  Hematological: Negative for adenopathy. Does not bruise/bleed easily.  Psychiatric/Behavioral: Negative.   All other systems reviewed and are negative.   Last CBC Lab Results  Component Value Date   WBC 11.9 (H) 05/03/2019   HGB 15.2 (H) 05/03/2019   HCT 44.4 05/03/2019   MCV 86.7 05/03/2019   MCH 29.7 05/03/2019   RDW 11.6 05/03/2019   PLT 222 [id number]   Last metabolic panel Lab Results  Component Value Date   GLUCOSE 331 (H) 05/03/2019   NA 131 (L)  05/03/2019   K 3.9 05/03/2019   CL 97 (L) 05/03/2019   CO2 23 05/03/2019   BUN 12 05/03/2019   CREATININE 0.62 05/03/2019   GFRNONAA >60 05/03/2019   GFRAA >60 05/03/2019   CALCIUM 9.2 05/03/2019   PROT 7.8 05/03/2019   ALBUMIN 4.4 05/03/2019   BILITOT 0.6 05/03/2019   ALKPHOS 103 05/03/2019  AST 21 05/03/2019   ALT 28 05/03/2019   ANIONGAP 11 05/03/2019   Last lipids No results found for: CHOL, HDL, LDLCALC, LDLDIRECT, TRIG, CHOLHDL Last hemoglobin A1c No results found for: HGBA1C Last thyroid functions No results found for: TSH, T3TOTAL, T4TOTAL, THYROIDAB Last vitamin D No results found for: 25OHVITD2, 25OHVITD3, VD25OH    Objective    BP (!) 140/92   Pulse 82   Temp (!) 97.1 F (36.2 C) (Oral)   Resp 16   Ht 5' 6.25" (1.683 m)   Wt 243 lb 6.4 oz (110.4 kg)   LMP 10/02/2018 (Exact Date)   SpO2 97%   BMI 38.99 kg/m  Physical Exam Vitals reviewed. Exam conducted with a chaperone present.  Constitutional:      General: She is not in acute distress.    Appearance: Normal appearance. She is well-developed. She is obese. She is not ill-appearing, toxic-appearing or diaphoretic.     Interventions: She is not intubated.    Comments: Patient is alert and oriented and responsive to questions Engages in eye contact with provider. Speaks in full sentences without any pauses without any shortness of breath or distress.   HENT:     Head: Normocephalic and atraumatic.     Right Ear: Tympanic membrane, ear canal and external ear normal.     Left Ear: Tympanic membrane, ear canal and external ear normal.     Nose: Nose normal. No congestion or rhinorrhea.     Mouth/Throat:     Mouth: Mucous membranes are moist.     Pharynx: No oropharyngeal exudate or posterior oropharyngeal erythema.  Eyes:     General: Lids are normal. No scleral icterus.       Right eye: No discharge.        Left eye: No discharge.     Conjunctiva/sclera: Conjunctivae normal.     Right eye: Right  conjunctiva is not injected. No exudate or hemorrhage.    Left eye: Left conjunctiva is not injected. No exudate or hemorrhage.    Pupils: Pupils are equal, round, and reactive to light.  Neck:     Thyroid: No thyroid mass or thyromegaly.     Vascular: Normal carotid pulses. No carotid bruit, hepatojugular reflux or JVD.     Trachea: Trachea and phonation normal. No tracheal tenderness or tracheal deviation.     Meningeal: Brudzinski's sign and Kernig's sign absent.  Cardiovascular:     Rate and Rhythm: Normal rate and regular rhythm.     Pulses: Normal pulses.          Radial pulses are 2+ on the right side and 2+ on the left side.       Dorsalis pedis pulses are 2+ on the right side and 2+ on the left side.       Posterior tibial pulses are 2+ on the right side and 2+ on the left side.     Heart sounds: Normal heart sounds, S1 normal and S2 normal. Heart sounds not distant. No murmur. No friction rub. No gallop.   Pulmonary:     Effort: Pulmonary effort is normal. No tachypnea, bradypnea, accessory muscle usage or respiratory distress. She is not intubated.     Breath sounds: Normal breath sounds. No stridor. No wheezing, rhonchi or rales.  Chest:     Chest wall: No mass or tenderness.     Breasts: Tanner Score is 5. Breasts are symmetrical.        Right: Mass and tenderness present.  No swelling, bleeding, inverted nipple, nipple discharge or skin change.        Left: Normal.       Comments: Approximately cm mass palpated 9 o' clock right breast. No skin dimpling or nipple discharge. Mild pain with palpation of area. No erythema, No axillary lymphadenopathy palpated.   No abnormalities in left breast palpated, axillary no lymphadenopathy  Abdominal:     General: Bowel sounds are normal. There is no distension or abdominal bruit.     Palpations: Abdomen is soft. There is no shifting dullness, fluid wave, hepatomegaly, splenomegaly, mass or pulsatile mass.     Tenderness: There is no  abdominal tenderness. There is no right CVA tenderness, left CVA tenderness, guarding or rebound.     Hernia: No hernia is present.  Musculoskeletal:        General: No tenderness or deformity. Normal range of motion.     Right upper arm: Normal.     Left upper arm: Normal.     Right wrist: Normal.     Left wrist: Normal.     Cervical back: Full passive range of motion without pain, normal range of motion and neck supple. No edema, erythema or rigidity. No spinous process tenderness or muscular tenderness. Normal range of motion.     Right lower leg: No swelling. 1+ Edema (trace bilateral / 1 + left pedal pulse PT.  2 + right pedal pulse PT non pitting ) present.     Left lower leg: No swelling. 1+ Edema present.     Right foot: Normal.     Left foot: Normal.       Legs:     Comments: Right lower extremity with mild atrophy to lowe leg, shiny erythema skin, posterior vascularity   noted. No warmth or coolness.   Left lower extremity with prominent vascularities , shiny , no erythema, no pain, no cool or warmth.   Dry skin.  Normal bilateral strength.  No popliteal cyst palpated bilaterally.   Lymphadenopathy:     Head:     Right side of head: No submental, submandibular, tonsillar, preauricular, posterior auricular or occipital adenopathy.     Left side of head: No submental, submandibular, tonsillar, preauricular, posterior auricular or occipital adenopathy.     Cervical: No cervical adenopathy.     Right cervical: No superficial, deep or posterior cervical adenopathy.    Left cervical: No superficial, deep or posterior cervical adenopathy.     Upper Body:     Right upper body: No supraclavicular, axillary or pectoral adenopathy.     Left upper body: No supraclavicular, axillary or pectoral adenopathy.  Skin:    General: Skin is warm and dry.     Capillary Refill: Capillary refill takes less than 2 seconds.     Coloration: Skin is not jaundiced or pale.     Findings: No abrasion,  bruising, burn, ecchymosis, erythema, lesion, petechiae or rash.     Nails: There is no clubbing.  Neurological:     Mental Status: She is alert and oriented to person, place, and time.     GCS: GCS eye subscore is 4. GCS verbal subscore is 5. GCS motor subscore is 6.     Cranial Nerves: No cranial nerve deficit.     Sensory: No sensory deficit.     Motor: No tremor, atrophy, abnormal muscle tone or seizure activity.     Coordination: Coordination normal.     Gait: Gait normal.     Deep  Tendon Reflexes: Reflexes are normal and symmetric. Reflexes normal. Babinski sign absent on the right side. Babinski sign absent on the left side.     Reflex Scores:      Tricep reflexes are 2+ on the right side and 2+ on the left side.      Bicep reflexes are 2+ on the right side and 2+ on the left side.      Brachioradialis reflexes are 2+ on the right side and 2+ on the left side.      Patellar reflexes are 2+ on the right side and 2+ on the left side.      Achilles reflexes are 2+ on the right side and 2+ on the left side. Psychiatric:        Speech: Speech normal.        Behavior: Behavior normal.        Thought Content: Thought content normal.        Judgment: Judgment normal.     Depression Screen PHQ 2/9 Scores 03/07/2020  PHQ - 2 Score 0  PHQ- 9 Score 1   No results found for any visits on 03/07/20.  Assessment & Plan     Osteoporosis screening - Plan: DG Bone Density, HgB A1c  BMI 40.0-44.9, adult (HCC) - Plan: Lipid panel, TSH  Encounter for screening mammogram for malignant neoplasm of breast - Plan: CBC with Differential/Platelet, Comprehensive metabolic panel  Encounter for routine adult health examination without abnormal findings  Peripheral vascular disease ([place]) - Plan: Ambulatory referral to Vascular Surgery  Hair loss - Plan: CBC with Differential/Platelet, Comprehensive metabolic panel, Lipid panel, TSH, Fe+TIBC+Fer, Hormone Panel, B12  Menopause - Plan: MM DIAG BREAST  TOMO BILATERAL  Screening for blood or protein in urine - Plan: POCT urinalysis dipstick  Controlled type 2 diabetes mellitus with complication, without long-term current use of insulin (HCC) - Plan: POCT urinalysis dipstick, POCT UA - Microalbumin  Breast mass- right breast 9 oclock  - Plan: US BREAST LTD UNI RIGHT INC AXILLA, MM DIAG BREAST TOMO BILATERAL  Body mass index (BMI) of 38.0-38.9 in adult  Tobacco use disorder, continuous  Tobacco abuse  PAP results requested.   Smoking cessation is recommended, patient not ready to quit at this time, she has tried Chantix in the past without any relief.  Provider did mention Wellbutrin to her as an option other options including Nicorette gum or patches.  He has a right breast mass at 9:00, plan is to do a breast diagnostic mammogram with a limited right breast ultrasound of right breast and axillary and at radiologist discretion no further testing is needed.  She will be sent to normal breast center.  He has a chronic history of erythema and shininess to her left lower extremity, as well as some mild to her right lower extremity.  She was checked for DVT at the urgent [facility] and Saturday and that was negative.  Has been like this for the last 4 to 5 years, I recommend that she see vascular for further work-up and evaluation given the duration and chronicity of this.  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  This could be causing some of the effect however I would like vascular to review and see if they think  further testing or work-up is warranted.  He has diabetes reports her blood sugars are usually great during the day, she does report that her blood glucose in the mornings is sometimes in the 200s to upper 200s fasting, however she does also report that she wakes  up in the middle the night around 2 or 3 with sweating and gets up and goes to the bathroom.  She has not checked her blood sugar at that time, she is advised to check her blood sugar at that time, discussed Somogyi effect and Dawns  phenomena.  Patient will keep a log and we will adjust medications as needed.  She will go for the mammogram and see vascular surgery.  She will also keep a log of her blood sugars.  And her hypertension, today she is 140/92 she will keep a log at home.  Discussed weight loss and healthy maintenance of activities.  She has had her yearly eye exam, she wears contacts.  Denies any changes  Recommend that patient have biannual dental screenings.  Is to follow-up with me in 3 months for recheck on hypertension and diabetes.  As well as she does she will keep a log of her blood glucoses and her pressure readings. Will call if there are any abnormals. Hemoglobin A 1C today she is fasting.   Orders Placed This Encounter  Procedures  . DG Bone Density    Standing Status:   Future    Standing Expiration Date:   05/06/2021    Order Specific Question:   Reason for Exam (SYMPTOM  OR DIAGNOSIS REQUIRED)    Answer:   screening for osteoporosis, patient has history of osteoarthritis    Order Specific Question:   Is the patient pregnant?    Answer:   No    Order Specific Question:   Preferred imaging location?    Answer:   [facility]  . US BREAST LTD UNI RIGHT INC AXILLA    Standing Status:   Future    Standing Expiration Date:   05/07/2021    Order Specific Question:   Reason for Exam (SYMPTOM  OR DIAGNOSIS REQUIRED)    Answer:   breast mass right breast present x  1 month - at 9 oclock    Order Specific Question:   Preferred imaging location?    Answer:   [facility]    Order Specific Question:   Call Results- Best Contact Number?    Answer:   DO:[id number]  . MM DIAG BREAST TOMO BILATERAL    Standing Status:   Future    Standing Expiration Date:   05/07/2021     Order Specific Question:   Reason for Exam (SYMPTOM  OR DIAGNOSIS REQUIRED)    Answer:   breast mass roight breast approximately  2 cm in size present x 1 month.    Order Specific Question:   Is the patient pregnant?    Answer:   No    Order Specific Question:   Preferred imaging location?    Answer:   [facility]  . CBC with Differential/Platelet  . Comprehensive metabolic panel  . Lipid panel  . TSH  . Fe+TIBC+Fer  . Hormone Panel  . B12  . HgB A1c  . Ambulatory referral to Vascular Surgery    Referral Priority:   Routine    Referral Type:   Surgical    Referral Reason:   Specialty Services Required    Referred to Provider:   Algernon Huxley, MD  Requested Specialty:   Vascular Surgery    Number of Visits Requested:   1  . POCT urinalysis dipstick  . POCT UA - Microalbumin    No orders of the defined types were placed in this encounter. She denies any need for medication refills, will call when needed.   RED flags for peripheral vascular reviewed. Chronic issue.  Hair loss, will check labs today and follow up as needed.   Return in about 3 months (around 06/07/2020), or if symptoms worsen or fail to improve, for at any time for any worsening symptoms, Go to Emergency room/ urgent care if worse.     Advised patient call the office or your primary care doctor for an appointment if no improvement within 72 hours or if any symptoms change or worsen at any time  Advised ER or urgent Care if after hours or on weekend. Call 911 for emergency symptoms at any time.Patinet verbalized understanding of all instructions given/reviewed and treatment plan and has no further questions or concerns at this time.     The entirety of the information documented in the History of Present Illness, Review of Systems and Physical Exam were personally obtained by me. Portions of this information were initially documented by the  Certified Medical Assistant whose name is documented in [place] and reviewed  by me for thoroughness and accuracy.  I have personally performed the exam and reviewed the chart and it is accurate to the best of my knowledge.  Haematologist has been used and any errors in dictation or transcription are unintentional.  Kelby Aline. [place], [place] [phone] (phone) [phone] (fax)  [place]

## 2020-03-07 ENCOUNTER — Other Ambulatory Visit: Payer: Self-pay

## 2020-03-07 ENCOUNTER — Encounter: Payer: Self-pay | Admitting: Adult Health

## 2020-03-07 ENCOUNTER — Ambulatory Visit: Payer: BC Managed Care – PPO | Admitting: Adult Health

## 2020-03-07 VITALS — BP 140/92 | HR 82 | Temp 97.1°F | Resp 16 | Ht 66.25 in | Wt 243.4 lb

## 2020-03-07 DIAGNOSIS — E118 Type 2 diabetes mellitus with unspecified complications: Secondary | ICD-10-CM | POA: Insufficient documentation

## 2020-03-07 DIAGNOSIS — L659 Nonscarring hair loss, unspecified: Secondary | ICD-10-CM | POA: Insufficient documentation

## 2020-03-07 DIAGNOSIS — Z1389 Encounter for screening for other disorder: Secondary | ICD-10-CM

## 2020-03-07 DIAGNOSIS — E1142 Type 2 diabetes mellitus with diabetic polyneuropathy: Secondary | ICD-10-CM | POA: Insufficient documentation

## 2020-03-07 DIAGNOSIS — I152 Hypertension secondary to endocrine disorders: Secondary | ICD-10-CM | POA: Insufficient documentation

## 2020-03-07 DIAGNOSIS — Z6837 Body mass index (BMI) 37.0-37.9, adult: Secondary | ICD-10-CM | POA: Insufficient documentation

## 2020-03-07 DIAGNOSIS — Z0000 Encounter for general adult medical examination without abnormal findings: Secondary | ICD-10-CM | POA: Insufficient documentation

## 2020-03-07 DIAGNOSIS — Z13820 Encounter for screening for osteoporosis: Secondary | ICD-10-CM | POA: Diagnosis not present

## 2020-03-07 DIAGNOSIS — N63 Unspecified lump in unspecified breast: Secondary | ICD-10-CM

## 2020-03-07 DIAGNOSIS — Z720 Tobacco use: Secondary | ICD-10-CM

## 2020-03-07 DIAGNOSIS — Z6838 Body mass index (BMI) 38.0-38.9, adult: Secondary | ICD-10-CM

## 2020-03-07 DIAGNOSIS — Z6841 Body Mass Index (BMI) 40.0 and over, adult: Secondary | ICD-10-CM | POA: Diagnosis not present

## 2020-03-07 DIAGNOSIS — Z1231 Encounter for screening mammogram for malignant neoplasm of breast: Secondary | ICD-10-CM | POA: Diagnosis not present

## 2020-03-07 DIAGNOSIS — E1159 Type 2 diabetes mellitus with other circulatory complications: Secondary | ICD-10-CM | POA: Insufficient documentation

## 2020-03-07 DIAGNOSIS — F17209 Nicotine dependence, unspecified, with unspecified nicotine-induced disorders: Secondary | ICD-10-CM | POA: Insufficient documentation

## 2020-03-07 DIAGNOSIS — I739 Peripheral vascular disease, unspecified: Secondary | ICD-10-CM

## 2020-03-07 DIAGNOSIS — Z780 Asymptomatic menopausal state: Secondary | ICD-10-CM | POA: Insufficient documentation

## 2020-03-07 LAB — POCT UA - MICROALBUMIN: Microalbumin Ur, POC: 20 mg/L

## 2020-03-07 NOTE — Addendum Note (Signed)
Addended by: Doreen Beam on: 03/07/2020 04:59 PM   Modules accepted: Orders

## 2020-03-07 NOTE — Patient Instructions (Addendum)
Read regarding Dawns phenomenon and somogyi effect with diabetes. Keep log of blood glucose.     Call to schedule your diagnostic  mammogram. Your orders have been placed for your exam.  Let our office know if you have questions, concerns, or any difficulty scheduling.  If normal results then yearly screening mammograms are recommended unless you notice  Changes in your breast then you should schedule a follow up office visit. If abnormal results  Further imaging will be warranted and sooner follow up as determined by the radiologist at the [facility].   [facility] [place], [place] [address]  Main: [phone]     Breast Scan A breast scan is an imaging test that is done to examine dense breast tissue. A breast scan is done using a radioactive material that produces images of the breast. A breast scan is used in people who have breast lesions that resulted from:  Rope-like, lumpy tissue (fibrocystic disease).  Solid, painless lumps (fibroadenoma).  Damaged fatty breast tissue (fat necrosis). A breast scan may also be done to find out the best treatment for people who have breast cancer. Tell a health care provider about:  Any allergies you have.  All medicines you are taking, including vitamins, herbs, eye drops, creams, and over-the-counter medicines.  Any problems you or family members have had with anesthetic medicines.  Any blood disorders you have.  Any surgeries you have had.  Any medical conditions you have.  Whether you are pregnant or may be pregnant, if this applies.  Whether you are breastfeeding, if this applies. What are the risks? Generally, this is a safe procedure. However, problems may occur, including:  Slight discomfort from injection of a radioactive substance.  Allergic reaction to a contrast or radioactive substance used during the procedure. What happens before the procedure?  Ask your  health care provider about changing or stopping your regular medicines. This is especially important if you are taking diabetes medicines or blood thinners. What happens during the procedure?  You will be asked to remove all jewelry and clothing from the waist up.  An IV tube will be inserted into one of your veins.  You will be asked to lie face-down on a table. The breast that will be scanned will be placed through an opening in the table. You may also be asked to get into different positions during the scan.  The radioactive agent will be injected into the IV tube. You may have a slight metallic taste after the injection.  A scanner will be placed over the breast to record the radiation, which produces an image of the breast.  The procedure may be repeated on the other breast.  When the scan is complete, the IV tube will be removed. The procedure may vary among health care providers and hospitals. What happens after the procedure?   You will be asked to get up slowly. This helps you avoid light-headedness after lying flat during the procedure.  Drink enough fluid to keep your urine clear or pale yellow. This helps to wash (flush) the remaining radioactive agent out of your body.  It is up to you to get the results of your procedure. Ask your health care provider, or the department that is doing the procedure, when your results will be ready. Summary  A breast scan is an imaging test that looks at breast lesions. A breast scan may also be done to find out the best  treatment for people who have breast cancer.  A radioactive substance is injected through an IV tube, and pictures are taken of the breasts.  After the procedure, drink enough fluid to keep your urine clear or pale yellow. This helps to wash (flush) the remaining radioactive agent out of your body. This information is not intended to replace advice given to you by your health care provider. Make sure you discuss any  questions you have with your health care provider. Document Revised: 12/11/2018 Document Reviewed: 06/18/2016 Elsevier Patient Education  [place].    Diabetes Basics      Diabetes (diabetes mellitus) is a long-term (chronic) disease. It occurs when the body does not properly use sugar (glucose) that is released from food after you eat. Diabetes may be caused by one or both of these problems:  Your pancreas does not make enough of a hormone called insulin.  Your body does not react in a normal way to insulin that it makes. Insulin lets sugars (glucose) go into cells in your body. This gives you energy. If you have diabetes, sugars cannot get into cells. This causes high blood sugar (hyperglycemia). Follow these instructions at home: How is diabetes treated? You may need to take insulin or other diabetes medicines daily to keep your blood sugar in balance. Take your diabetes medicines every day as told by your doctor. List your diabetes medicines here: Diabetes medicines  Name of medicine: ______________________________ ? Amount (dose): _______________ Time (a.m./p.m.): _______________ Notes: ___________________________________  Name of medicine: ______________________________ ? Amount (dose): _______________ Time (a.m./p.m.): _______________ Notes: ___________________________________  Name of medicine: ______________________________ ? Amount (dose): _______________ Time (a.m./p.m.): _______________ Notes: ___________________________________ If you use insulin, you will learn how to give yourself insulin by injection. You may need to adjust the amount based on the food that you eat. List the types of insulin you use here: Insulin  Insulin type: ______________________________ ? Amount (dose): _______________ Time (a.m./p.m.): _______________ Notes: ___________________________________  Insulin type: ______________________________ ? Amount (dose): _______________ Time  (a.m./p.m.): _______________ Notes: ___________________________________  Insulin type: ______________________________ ? Amount (dose): _______________ Time (a.m./p.m.): _______________ Notes: ___________________________________  Insulin type: ______________________________ ? Amount (dose): _______________ Time (a.m./p.m.): _______________ Notes: ___________________________________  Insulin type: ______________________________ ? Amount (dose): _______________ Time (a.m./p.m.): _______________ Notes: ___________________________________ How do I manage my blood sugar?  Check your blood sugar levels using a blood glucose monitor as directed by your doctor. Your doctor will set treatment goals for you. Generally, you should have these blood sugar levels:  Before meals (preprandial): 80-130 mg/dL (4.4-7.2 mmol/L).  After meals (postprandial): below 180 mg/dL (10 mmol/L).  A1c level: less than 7%. Write down the times that you will check your blood sugar levels: Blood sugar checks  Time: _______________ Notes: ___________________________________  Time: _______________ Notes: ___________________________________  Time: _______________ Notes: ___________________________________  Time: _______________ Notes: ___________________________________  Time: _______________ Notes: ___________________________________  Time: _______________ Notes: ___________________________________  What do I need to know about low blood sugar? Low blood sugar is called hypoglycemia. This is when blood sugar is at or below 70 mg/dL (3.9 mmol/L). Symptoms may include:  Feeling: ? Hungry. ? Worried or nervous (anxious). ? Sweaty and clammy. ? Confused. ? Dizzy. ? Sleepy. ? Sick to your stomach (nauseous).  Having: ? A fast heartbeat. ? A headache. ? A change in your vision. ? Tingling or no feeling (numbness) around the mouth, lips, or tongue. ? Jerky movements that you cannot control  (seizure).  Having trouble with: ? Moving (coordination). ? Sleeping. ?  Passing out (fainting). ? Getting upset easily (irritability). Treating low blood sugar To treat low blood sugar, eat or drink something sugary right away. If you can think clearly and swallow safely, follow the 15:15 rule:  Take 15 grams of a fast-acting carb (carbohydrate). Talk with your doctor about how much you should take.  Some fast-acting carbs are: ? Sugar tablets (glucose pills). Take 3-4 glucose pills. ? 6-8 pieces of hard candy. ? 4-6 oz (120-150 mL) of fruit juice. ? 4-6 oz (120-150 mL) of regular (not diet) soda. ? 1 Tbsp (15 mL) honey or sugar.  Check your blood sugar 15 minutes after you take the carb.  If your blood sugar is still at or below 70 mg/dL (3.9 mmol/L), take 15 grams of a carb again.  If your blood sugar does not go above 70 mg/dL (3.9 mmol/L) after 3 tries, get help right away.  After your blood sugar goes back to normal, eat a meal or a snack within 1 hour. Treating very low blood sugar If your blood sugar is at or below 54 mg/dL (3 mmol/L), you have very low blood sugar (severe hypoglycemia). This is an emergency. Do not wait to see if the symptoms will go away. Get medical help right away. Call your local emergency services (911 in the [place] Do not drive yourself to the hospital. Questions to ask your health care provider  Do I need to meet with a diabetes educator?  What equipment will I need to care for myself at home?  What diabetes medicines do I need? When should I take them?  How often do I need to check my blood sugar?  What number can I call if I have questions?  When is my next doctor's visit?  Where can I find a support group for people with diabetes? Where to find more information  American Diabetes Association: www.diabetes.org  American Association of Diabetes Educators: www.diabeteseducator.org/patient-resources Contact a doctor if:  Your blood  sugar is at or above 240 mg/dL (13.3 mmol/L) for 2 days in a row.  You have been sick or have had a fever for 2 days or more, and you are not getting better.  You have any of these problems for more than 6 hours: ? You cannot eat or drink. ? You feel sick to your stomach (nauseous). ? You throw up (vomit). ? You have watery poop (diarrhea). Get help right away if:  Your blood sugar is lower than 54 mg/dL (3 mmol/L).  You get confused.  You have trouble: ? Thinking clearly. ? Breathing. Summary  Diabetes (diabetes mellitus) is a long-term (chronic) disease. It occurs when the body does not properly use sugar (glucose) that is released from food after digestion.  Take insulin and diabetes medicines as told.  Check your blood sugar every day, as often as told.  Keep all follow-up visits as told by your doctor. This is important. This information is not intended to replace advice given to you by your health care provider. Make sure you discuss any questions you have with your health care provider. Document Revised: 07/14/2019 Document Reviewed: 01/23/2018 Elsevier Patient Education  [place].  Peripheral Vascular Disease  Peripheral vascular disease (PVD) is a disease of the blood vessels that are not part of your heart and brain. A simple term for PVD is poor circulation. In most cases, PVD narrows the blood vessels that carry blood from your heart to the rest of your body. This can reduce the  supply of blood to your arms, legs, and internal organs, like your stomach or kidneys. However, PVD most often affects a person's lower legs and feet. Without treatment, PVD tends to get worse. PVD can also lead to acute ischemic limb. This is when an arm or leg suddenly cannot get enough blood. This is a medical emergency. Follow these instructions at home: Lifestyle  Do not use any products that contain nicotine or tobacco, such as cigarettes and e-cigarettes. If you need help  quitting, ask your doctor.  Lose weight if you are overweight. Or, stay at a healthy weight as told by your doctor.  Eat a diet that is low in fat and cholesterol. If you need help, ask your doctor.  Exercise regularly. Ask your doctor for activities that are right for you. General instructions  Take over-the-counter and prescription medicines only as told by your doctor.  Take good care of your feet: ? Wear comfortable shoes that fit well. ? Check your feet often for any cuts or sores.  Keep all follow-up visits as told by your doctor This is important. Contact a doctor if:  You have cramps in your legs when you walk.  You have leg pain when you are at rest.  You have coldness in a leg or foot.  Your skin changes.  You are unable to get or have an erection (erectile dysfunction).  You have cuts or sores on your feet that do not heal. Get help right away if:  Your arm or leg turns cold, numb, and blue.  Your arms or legs become red, warm, swollen, painful, or numb.  You have chest pain.  You have trouble breathing.  You suddenly have weakness in your face, arm, or leg.  You become very confused or you cannot speak.  You suddenly have a very bad headache.  You suddenly cannot see. Summary  Peripheral vascular disease (PVD) is a disease of the blood vessels.  A simple term for PVD is poor circulation. Without treatment, PVD tends to get worse.  Treatment may include exercise, low fat and low cholesterol diet, and quitting smoking. This information is not intended to replace advice given to you by your health care provider. Make sure you discuss any questions you have with your health care provider. Document Revised: 10/03/2017 Document Reviewed: 11/28/2016 Elsevier Patient Education  2020 [place] de alimentacin restringido en grasas y colesterol Fat and Cholesterol Restricted Eating Plan El exceso de grasas y colesterol en la dieta puede causar  problemas de [place]. Elegir los alimentos adecuados ayuda a [employer] niveles de grasas y [place]. Esto puede evitarle contraer ciertas enfermedades. El mdico puede recomendarle un plan de alimentacin que incluya lo siguiente:  Grasas totales: ______% o menos del total de caloras por da.  Grasas saturadas: ______% o menos del total de caloras por da.  Colesterol: menos de _________mg Darlin Coco.  Fibra: ______g Darlin Coco. Cules son algunos consejos para seguir este plan? Planificacin de las comidas  En las comidas, [place] su plato en cuatro partes iguales: ? Llene la mitad del plato con verduras y ensaladas de hojas verdes. ? Llene un cuarto del plato con cereales integrales. ? Llene un cuarto del plato con alimentos con protenas con bajo contenido de grasas [employer]).  Coma pescado con alto contenido de grasas omega3 al [employer] veces por semana. Esas grasas se encuentran en la caballa, el atn, las sardinas y el salmn.  Coma alimentos con alto  contenido de [place], como cereales [place], frijoles, [place], [place], [place], guisantes y [place]. Consejos generales   Si necesita adelgazar, consulte a su mdico.  Evite lo siguiente: ? Alimentos con Holiday representative. ? Comidas fritas. ? Alimentos con aceites parcialmente hidrogenados.  Limite el consumo de alcohol a no ms de 58medida por da si es mujer y no est [place], y 35medidas por da si es hombre. Una medida equivale a 12oz de Chartered certified accountant, 5oz de vino o 1oz de bebidas alcohlicas de alta graduacin. Lea las etiquetas de los alimentos  Lea las etiquetas de los alimentos para conocer lo siguiente: ? Si contienen grasas trans. ? Si contienen aceites parcialmente hidrogenados. ? La cantidad de grasas saturadas (g) que contiene cada porcin. ? La cantidad de colesterol (mg) que contiene cada porcin. ? La cantidad de fibra (g) que contiene cada porcin.  Elija alimentos con grasas saludables,  tales como las siguientes: ? Grasas monoinsaturadas. ? Grasas poliinsaturadas. ? Grasas omega3.  Elija productos de cereal que tengan cereales integrales. Busque la palabra "integral" en Equities trader de la lista de ingredientes. Al cocinar  Emplee mtodos de coccin con poca cantidad de grasa. Por ejemplo, hornear, hervir, grillar y Holiday representative.  Coma ms comidas caseras. Coma en restaurantes y bares con menos frecuencia.  Evite cocinar usando grasas saturadas, como [place], crema, aceite de [place], aceite de palmiste y aceite de [place]. Rudolph recomendados  Frutas  Frutas frescas, en conserva (en su jugo natural) o frutas congeladas. Verduras  Verduras frescas o congeladas (crudas, al vapor, asadas o grilladas). Ensaladas de hojas verdes. Granos  Cereales integrales, como panes, galletas, cereales y pastas de integrales o de trigo integral. Avena sin endulzar, trigo bulgur, cebada, quinua o arroz integral. Tortillas de harina de maz o trigo integral. Carnes y otros alimentos ricos en protenas  Carne de res molida (al 85% o ms magra), carne de res de animales alimentados con pastos o carne de res sin la grasa. Pollo o pavo sin piel. Carne de pollo o de [place]. Cerdo sin la grasa. Todos los pescados y frutos de mar. Claras de huevo. Porotos, guisantes o lentejas secos. Frutos secos o semillas sin sal. Frijoles enlatados sin sal. Mantequillas de frutos secos sin azcar ni aceite agregados. Lcteos  Productos lcteos descremados o semidescremados, como [employer] o al 1%, quesos reducidos en grasas o al 2%, queso cottage o ricota con bajo contenido de Laurie o sin contenido de [place], o yogur natural descremado o semidescremado. Grasas y aceites  Margarina untable que no contenga grasas trans. [place] y condimentos para ensaladas livianos o reducidos en grasas. [place]. Aceites de oliva, canola, ssamo o crtamo. Es posible que los productos que se enumeran ms [place] no  constituyan una lista completa de los alimentos y las bebidas que puede tomar. Consulte a un nutricionista para obtener ms informacin. Alimentos que deben evitarse Frutas  Fruta enlatada en almbar espeso. Frutas con salsa de crema o mantequilla. Frutas cocidas en aceite. Verduras  Verduras cocinadas con salsas de queso, crema o mantequilla. Verduras fritas. Granos  Pan blanco. Pastas blancas. Arroz blanco. Pan de maz. Bagels, pasteles y croissants. Galletas saladas y colaciones que contengan grasas trans y aceites hidrogenados. Carnes y otros alimentos ricos en protenas  [place]ortes de carne con alto contenido de [place]. Costillas, alas de pollo, tocineta, salchicha, mortadela, salame, chinchulines, tocino, perros calientes, salchichas alemanas y embutidos envasados. Hgado y otros rganos. Huevos enteros y yemas de [place]. Pollo y pavo con piel. Carne frita.  Lcteos  [place]he entera o al 2%, crema, mezcla de [place] y crema, y queso crema. Quesos enteros. Yogur entero o endulzado. Quesos con toda su grasa. [place] no lcteas y coberturas batidas. Quesos procesados, quesos para untar y [place]. Bebidas  Alcohol. Bebidas endulzadas con azcar, como refrescos, limonada y bebidas frutales. Grasas y aceites  [place], [place] en barra, [place] de [place], [place], [place] clarificada o grasa de tocino. Aceites de coco, de palmiste y de palma. Dulces y postres  Jarabe de maz, azcares, miel y Control and instrumentation engineer. Caramelos. Mermeladas y [place]. Chrissie Noa. Cereales endulzados. Galletas, pasteles, bizcochuelos, donas, muffins y helado. Es posible que los productos que se enumeran ms [place] no constituyan una lista completa de los alimentos y las bebidas que Nurse, adult. Consulte a un nutricionista para obtener ms informacin. Resumen  Elegir los alimentos adecuados ayuda a Theatre manager normales los niveles de grasas y [place]. Esto puede evitarle contraer ciertas enfermedades.  En las comidas, llene la  mitad del plato con verduras y ensaladas de hojas verdes.  Coma alimentos con alto contenido de [place], como cereales [place], frijoles, [place], zanahorias, guisantes y [place].  Limite los alimentos con azcar agregada y grasas saturadas, el alcohol y las comidas fritas. Esta informacin no tiene Marine scientist el consejo del mdico. Asegrese de hacerle al mdico cualquier pregunta que tenga. Document Revised: 06/24/2018 Document Reviewed: 10/08/2017 Elsevier Patient Education  2020 [place] with Quitting Smoking  Quitting smoking is a physical and mental challenge. You will face cravings, withdrawal symptoms, and temptation. Before quitting, work with your health care provider to make a plan that can help you cope. Preparation can help you quit and keep you from giving in. How can I cope with cravings? Cravings usually last for 5-10 minutes. If you get through it, the craving will pass. Consider taking the following actions to help you cope with cravings:  Keep your mouth busy: ? Chew sugar-free gum. ? Suck on hard candies or a straw. ? Brush your teeth.  Keep your hands and body busy: ? Immediately change to a different activity when you feel a craving. ? Squeeze or play with a ball. ? Do an activity or a hobby, like making bead jewelry, practicing needlepoint, or working with wood. ? Mix up your normal routine. ? Take a short exercise break. Go for a quick walk or run up and down stairs. ? Spend time in public places where smoking is not allowed.  Focus on doing something kind or helpful for someone else.  Call a friend or family member to talk during a craving.  Join a support group.  Call a quit line, such as 1-800-QUIT-NOW.  Talk with your health care provider about medicines that might help you cope with cravings and make quitting easier for you. How can I deal with withdrawal symptoms? Your body may experience negative effects as it tries to get  used to not having nicotine in the system. These effects are called withdrawal symptoms. They may include:  Feeling hungrier than normal.  Trouble concentrating.  Irritability.  Trouble sleeping.  Feeling depressed.  Restlessness and agitation.  Craving a cigarette. To manage withdrawal symptoms:  Avoid places, people, and activities that trigger your cravings.  Remember why you want to quit.  Get plenty of sleep.  Avoid coffee and other caffeinated drinks. These may worsen some of your symptoms. How can I handle social situations? Social situations can be difficult when you are quitting smoking, especially in the first few  weeks. To manage this, you can:  Avoid parties, bars, and other social situations where people might be smoking.  Avoid alcohol.  Leave right away if you have the urge to smoke.  Explain to your family and friends that you are quitting smoking. Ask for understanding and support.  Plan activities with friends or family where smoking is not an option. What are some ways I can cope with stress? Wanting to smoke may cause stress, and stress can make you want to smoke. Find ways to manage your stress. Relaxation techniques can help. For example:  Breathe slowly and deeply, in through your nose and out through your mouth.  Listen to soothing, relaxing music.  Talk with a family member or friend about your stress.  Light a candle.  Soak in a bath or take a shower.  Think about a peaceful place. What are some ways I can prevent weight gain? Be aware that many people gain weight after they quit smoking. However, not everyone does. To keep from gaining weight, have a plan in place before you quit and stick to the plan after you quit. Your plan should include:  Having healthy snacks. When you have a craving, it may help to: ? Eat plain popcorn, crunchy carrots, celery, or other cut vegetables. ? Chew sugar-free gum.  Changing how you eat: ? Eat small  portion sizes at meals. ? Eat 4-6 small meals throughout the day instead of 1-2 large meals a day. ? Be mindful when you eat. Do not watch television or do other things that might distract you as you eat.  Exercising regularly: ? Make time to exercise each day. If you do not have time for a long workout, do short bouts of exercise for 5-10 minutes several times a day. ? Do some form of strengthening exercise, like weight lifting, and some form of aerobic exercise, like running or swimming.  Drinking plenty of water or other low-calorie or no-calorie drinks. Drink 6-8 glasses of water daily, or as much as instructed by your health care provider. Summary  Quitting smoking is a physical and mental challenge. You will face cravings, withdrawal symptoms, and temptation to smoke again. Preparation can help you as you go through these challenges.  You can cope with cravings by keeping your mouth busy (such as by chewing gum), keeping your body and hands busy, and making calls to family, friends, or a helpline for people who want to quit smoking.  You can cope with withdrawal symptoms by avoiding places where people smoke, avoiding drinks with caffeine, and getting plenty of rest.  Ask your health care provider about the different ways to prevent weight gain, avoid stress, and handle social situations. This information is not intended to replace advice given to you by your health care provider. Make sure you discuss any questions you have with your health care provider. Document Revised: 10/03/2017 Document Reviewed: 10/18/2016 Elsevier Patient Education  2020 [place] regarding Dawns phenomenon and somogyi effect with diabetes. Keep log of blood glucose.

## 2020-03-07 NOTE — Progress Notes (Signed)
Less than 30 micro albumin urine is within normal. Yours is 20.

## 2020-03-08 ENCOUNTER — Other Ambulatory Visit: Payer: Self-pay | Admitting: Adult Health

## 2020-03-08 ENCOUNTER — Encounter: Payer: Self-pay | Admitting: Adult Health

## 2020-03-08 ENCOUNTER — Ambulatory Visit: Payer: BC Managed Care – PPO | Attending: Internal Medicine

## 2020-03-08 DIAGNOSIS — Z23 Encounter for immunization: Secondary | ICD-10-CM

## 2020-03-08 DIAGNOSIS — E118 Type 2 diabetes mellitus with unspecified complications: Secondary | ICD-10-CM

## 2020-03-08 DIAGNOSIS — L659 Nonscarring hair loss, unspecified: Secondary | ICD-10-CM

## 2020-03-08 NOTE — Progress Notes (Signed)
  B12 within normal limits. Hormone lab is pending.  Thyroid level is within normal limits.  Triglycerides elevated likely due to diabetes not controlled. Diet recommendations to avoid foods fried, processed and high in triglycerides.  Ferritin level is elevated - verify any excessive alcohol intake, taking iron, or any hair supplements, family history of hemochromatosis ?  Kidney and liver function is within normal. WBC is mildly elevated, likely from smoking, verify no signs of infection ?   Recheck CBC in 3 months, ferritin level, and Hemoglobin A 1C.  I would like to refer her to endocrinology for her diabetes and hair loss, will you see where she prefers - and we can place order.

## 2020-03-08 NOTE — Progress Notes (Signed)
   Covid-19 Vaccination Clinic  Name:  Kristin Cherry    MRN: [id number] DOB: 01/05/1966  03/08/2020  [name] was observed post Covid-19 immunization for 15 minutes without incident. She was provided with Vaccine Information Sheet and instruction to access the V-Safe system.   [name] was instructed to call 911 with any severe reactions post vaccine: Marland Kitchen Difficulty breathing  . Swelling of face and throat  . A fast heartbeat  . A bad rash all over body  . Dizziness and weakness   Immunizations Administered    Name Date Dose VIS Date Route   Pfizer COVID-19 Vaccine 03/08/2020  4:13 PM 0.3 mL 12/29/2018 Intramuscular   Manufacturer: [employer], [employer]   Lot: [id number]   [place]: [id number]

## 2020-03-08 NOTE — Progress Notes (Signed)
Add on hemoglobin A1C was done. Was ordered but not drawn, should result within 2 days- no more blood needed. Referral to endocrinology has been placed. Inform if not heard from referral within 2 weeks.

## 2020-03-09 ENCOUNTER — Ambulatory Visit
Admission: RE | Admit: 2020-03-09 | Discharge: 2020-03-09 | Disposition: A | Discharge status: Home / Self Care | Payer: BC Managed Care – PPO | Source: Ambulatory Visit | Attending: Adult Health | Admitting: Adult Health

## 2020-03-09 ENCOUNTER — Ambulatory Visit: Admission: RE | Admit: 2020-03-09 | Payer: BC Managed Care – PPO | Source: Ambulatory Visit

## 2020-03-09 DIAGNOSIS — N63 Unspecified lump in unspecified breast: Secondary | ICD-10-CM

## 2020-03-09 DIAGNOSIS — R928 Other abnormal and inconclusive findings on diagnostic imaging of breast: Secondary | ICD-10-CM | POA: Diagnosis not present

## 2020-03-09 DIAGNOSIS — Z780 Asymptomatic menopausal state: Secondary | ICD-10-CM | POA: Insufficient documentation

## 2020-03-09 DIAGNOSIS — N6315 Unspecified lump in the right breast, overlapping quadrants: Secondary | ICD-10-CM | POA: Diagnosis not present

## 2020-03-09 NOTE — Progress Notes (Signed)
Yes. Follow and repeat in 6 months.  Radiology would make it explicit if she needed surgical consult.  You can trust theirrecommendation

## 2020-03-10 LAB — HEMOGLOBIN A1C
Est. average glucose Bld gHb Est-mCnc: 214 mg/dL
Hgb A1c MFr Bld: 9.1 % — ABNORMAL HIGH (ref 4.8–5.6)

## 2020-03-10 LAB — SPECIMEN STATUS REPORT

## 2020-03-10 NOTE — Progress Notes (Signed)
Hemoglobin A1C is 9.1. Increased exercise and strict dietary changes. Keep log of blood glucose fasting in the morning and check when you wake up in the middle of the night and feel like you might have a lower glucose as reported in the office. I have placed a referral to endocrinology and you should here within 2 weeks. We will not change medications at this time - keep log

## 2020-03-10 NOTE — Progress Notes (Signed)
Right breast ultrasound in 6 months to reassess right breast mass 9 o'clock position.

## 2020-03-14 ENCOUNTER — Telehealth: Payer: Self-pay | Admitting: Adult Health

## 2020-03-14 ENCOUNTER — Other Ambulatory Visit: Payer: Self-pay | Admitting: Adult Health

## 2020-03-14 MED ORDER — OZEMPIC (1 MG/DOSE) 2 MG/1.5ML ~~LOC~~ SOPN: PEN_INJECTOR | SUBCUTANEOUS | 0 refills | Status: DC

## 2020-03-14 NOTE — Telephone Encounter (Signed)
Pt request refill  OZEMPIC, 1 MG/DOSE, 2 MG/1.5ML SOPN  90 day  Pt is new to [facility] and she has never written this Rx for the pt.  Please send to   [id number] - [place], [place] - [address] Phone:  [phone]  Fax:  [phone]

## 2020-03-14 NOTE — Telephone Encounter (Signed)
Provider refilled Ozempic 03/14/2020

## 2020-03-14 NOTE — Telephone Encounter (Signed)
Please verify with patient strength of her pens and how much she has been taking once a week  ?

## 2020-03-14 NOTE — Telephone Encounter (Signed)
Patient says that she is still taking the same dosage of Ozempic 1 MG weekly.

## 2020-03-18 LAB — COMPREHENSIVE METABOLIC PANEL
ALT: 20 IU/L (ref 0–32)
AST: 17 IU/L (ref 0–40)
Albumin/Globulin Ratio: 1.3 (ref 1.2–2.2)
Albumin: 4.1 g/dL (ref 3.8–4.9)
Alkaline Phosphatase: 100 IU/L (ref 39–117)
BUN/Creatinine Ratio: 14 (ref 9–23)
BUN: 9 mg/dL (ref 6–24)
Bilirubin Total: 0.3 mg/dL (ref 0.0–1.2)
CO2: 24 mmol/L (ref 20–29)
Calcium: 9.5 mg/dL (ref 8.7–10.2)
Chloride: 102 mmol/L (ref 96–106)
Creatinine, Ser: 0.65 mg/dL (ref 0.57–1.00)
GFR calc Af Amer: 116 mL/min/{1.73_m2} (ref >59)
GFR calc non Af Amer: 101 mL/min/{1.73_m2} (ref >59)
Globulin, Total: 3.1 g/dL (ref 1.5–4.5)
Glucose: 152 mg/dL — ABNORMAL HIGH (ref 65–99)
Potassium: 4.2 mmol/L (ref 3.5–5.2)
Sodium: 141 mmol/L (ref 134–144)
Total Protein: 7.2 g/dL (ref 6.0–8.5)

## 2020-03-18 LAB — CBC WITH DIFFERENTIAL/PLATELET
Basophils Absolute: 0.1 10*3/uL (ref 0.0–0.2)
Basos: 0 %
EOS (ABSOLUTE): 0.2 10*3/uL (ref 0.0–0.4)
Eos: 2 %
Hematocrit: 43.8 % (ref 34.0–46.6)
Hemoglobin: 14.7 g/dL (ref 11.1–15.9)
Immature Grans (Abs): 0 10*3/uL (ref 0.0–0.1)
Immature Granulocytes: 0 %
Lymphocytes Absolute: 3.5 10*3/uL — ABNORMAL HIGH (ref 0.7–3.1)
Lymphs: 30 %
MCH: 29.8 pg (ref 26.6–33.0)
MCHC: 33.6 g/dL (ref 31.5–35.7)
MCV: 89 fL (ref 79–97)
Monocytes Absolute: 0.8 10*3/uL (ref 0.1–0.9)
Monocytes: 6 %
Neutrophils Absolute: 7.1 10*3/uL — ABNORMAL HIGH (ref 1.4–7.0)
Neutrophils: 62 %
Platelets: 289 10*3/uL (ref 150–450)
RBC: 4.94 x10E6/uL (ref 3.77–5.28)
RDW: 13.1 % (ref 11.7–15.4)
WBC: 11.7 10*3/uL — ABNORMAL HIGH (ref 3.4–10.8)

## 2020-03-18 LAB — LIPID PANEL
Chol/HDL Ratio: 4.6 ratio — ABNORMAL HIGH (ref 0.0–4.4)
Cholesterol, Total: 195 mg/dL (ref 100–199)
HDL: 42 mg/dL (ref >39)
LDL Chol Calc (NIH): 124 mg/dL — ABNORMAL HIGH (ref 0–99)
Triglycerides: 164 mg/dL — ABNORMAL HIGH (ref 0–149)
VLDL Cholesterol Cal: 29 mg/dL (ref 5–40)

## 2020-03-18 LAB — HORMONE PANEL (T4,TSH,FSH,TESTT,SHBG,DHEA,ETC)
DHEA-Sulfate, LCMS: 32 ug/dL
Estradiol, Serum, MS: 12 pg/mL
Estrone Sulfate: 27 ng/dL
Follicle Stimulating Hormone: 31 m[IU]/mL
Free T-3: 3.3 pg/mL
Free Testosterone, Serum: 4 pg/mL
Progesterone, Serum: <10 ng/dL
Sex Hormone Binding Globulin: 25.7 nmol/L
T4: 7.2 ug/dL
TSH: 0.79 uU/mL
Testosterone, Serum (Total): 25 ng/dL
Testosterone-% Free: 1.6 %
Triiodothyronine (T-3), Serum: 129 ng/dL

## 2020-03-18 LAB — TSH: TSH: 0.79 u[IU]/mL (ref 0.450–4.500)

## 2020-03-18 LAB — IRON,TIBC AND FERRITIN PANEL
Ferritin: 177 ng/mL — ABNORMAL HIGH (ref 15–150)
Iron Saturation: 21 % (ref 15–55)
Iron: 67 ug/dL (ref 27–159)
Total Iron Binding Capacity: 323 ug/dL (ref 250–450)
UIBC: 256 ug/dL (ref 131–425)

## 2020-03-18 LAB — VITAMIN B12: Vitamin B-12: 251 pg/mL (ref 232–1245)

## 2020-03-21 ENCOUNTER — Other Ambulatory Visit: Payer: Self-pay

## 2020-03-21 ENCOUNTER — Other Ambulatory Visit: Payer: Self-pay | Admitting: Adult Health

## 2020-03-21 ENCOUNTER — Encounter (INDEPENDENT_AMBULATORY_CARE_PROVIDER_SITE_OTHER): Payer: Self-pay | Admitting: Vascular Surgery

## 2020-03-21 ENCOUNTER — Ambulatory Visit (INDEPENDENT_AMBULATORY_CARE_PROVIDER_SITE_OTHER): Payer: BC Managed Care – PPO | Admitting: Vascular Surgery

## 2020-03-21 VITALS — BP 126/84 | HR 74 | Ht 66.0 in | Wt 239.0 lb

## 2020-03-21 DIAGNOSIS — M79661 Pain in right lower leg: Secondary | ICD-10-CM

## 2020-03-21 DIAGNOSIS — M79605 Pain in left leg: Secondary | ICD-10-CM

## 2020-03-21 DIAGNOSIS — E118 Type 2 diabetes mellitus with unspecified complications: Secondary | ICD-10-CM

## 2020-03-21 DIAGNOSIS — I872 Venous insufficiency (chronic) (peripheral): Secondary | ICD-10-CM | POA: Diagnosis not present

## 2020-03-21 DIAGNOSIS — M79609 Pain in unspecified limb: Secondary | ICD-10-CM | POA: Insufficient documentation

## 2020-03-21 NOTE — Assessment & Plan Note (Signed)
blood glucose control important in reducing the progression of atherosclerotic disease. Also, involved in wound healing. On appropriate medications.  

## 2020-03-21 NOTE — Assessment & Plan Note (Signed)
The patient has fairly pronounced stasis dermatitis changes on the left leg.  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  It is very possible there is some role of circulatory dysfunction associated with this as well.  ABIs and reflux study will be done in the near future at her convenience.  We will see her back following the studies.

## 2020-03-21 NOTE — Progress Notes (Signed)
Patient ID: ALAHNI VARONE, female   DOB: 04/19/1966, 54 y.o.   MRN: [id number]  Chief Complaint  Patient presents with  . New Patient (Initial Visit)    PVD    HPI TREVIA NOP is a 54 y.o. female.  I am asked to see the patient by M. Flinchum, NP for evaluation of marked discoloration as well as some weakness and pain in the lower extremities.  The left lower extremity is more severely affected the 2 legs.  This is the leg that has been smaller from birth.  It is noticeably smaller although she walks reasonably well and has fairly normal activity with it.  She has had no DVT or superficial thrombophlebitis to her knowledge.  No major trauma or injury to her legs.  She does have fairly longstanding diabetes.  No open wounds or infection.  She is able to walk reasonably well without pain and does not have typical claudication symptoms.  The left leg does not really have a lot of swelling.  Discoloration has gradually gotten worse over a couple of years, and is quite prominent today   Past Medical History:  Diagnosis Date  . Anemia   . Asthma   . Diabetes mellitus without complication ([place])   . History of bronchitis   . Hyperlipidemia   . Hypertension   . Obesity     Past Surgical History:  Procedure Laterality Date  . CHOLECYSTECTOMY    . COLONOSCOPY WITH PROPOFOL N/A 12/26/2017   Procedure: COLONOSCOPY WITH PROPOFOL;  Surgeon: Manya Silvas, MD;  Location: [facility] ENDOSCOPY;  Service: Endoscopy;  Laterality: N/A;  . KNEE SURGERY     slow the growth of right knee patient reports in 1979 and left knee cartilage repair in 2000  . SALPINGOOPHORECTOMY Right      Family History  Problem Relation Age of Onset  . Cancer Mother   . Hypertension Mother   . Hypertension Father   . Cancer - Other Father   . Congestive Heart Failure Father   . Cancer Sister   . Diabetes Sister   . Hypertension Sister   . Cancer Brother   . Hypertension Brother   . Diabetes Maternal  Grandmother   . Congestive Heart Failure Paternal Grandmother   . Hypertension Paternal Grandfather   . Diabetes Sister       Social History   Tobacco Use  . Smoking status: Current Every Day Smoker    Packs/day: 0.50    Types: Cigarettes, E-cigarettes  . Smokeless tobacco: Never Used  Substance Use Topics  . Alcohol use: Not Currently    Comment: rarely  . Drug use: No     Allergies  Allergen Reactions  . Lodine [Etodolac] Hives  . Penicillins Hives    Current Outpatient Medications  Medication Sig Dispense Refill  . albuterol (PROVENTIL) (2.5 MG/3ML) 0.083% nebulizer solution Take 2.5 mg by nebulization every 6 (six) hours as needed for wheezing or shortness of breath.    . gabapentin (NEURONTIN) 100 MG capsule     . hydrochlorothiazide (HYDRODIURIL) 25 MG tablet Take 25 mg by mouth daily.    Marland Kitchen JANUMET XR 50-1000 MG TB24 Take 2 tablets by mouth daily.    Marland Kitchen lisinopril (ZESTRIL) 20 MG tablet Take 20 mg by mouth daily.    . meloxicam (MOBIC) 15 MG tablet Take 1 tablet (15 mg total) by mouth daily. Take with food. 30 tablet 0  . methocarbamol (ROBAXIN) 750 MG tablet Take 1  tablet (750 mg total) by mouth 4 (four) times daily. 21 tablet 0  . OZEMPIC, 1 MG/DOSE, 2 MG/1.5ML SOPN INJECT 1MG DOSE SUBCUTANEOUSLY WEEKLY. 4 pen 0  . fluticasone (FLONASE) 50 MCG/ACT nasal spray Place 1 spray into both nostrils daily for 7 days. 16 g 2   No current facility-administered medications for this visit.      REVIEW OF SYSTEMS (Negative unless checked)  Constitutional: _0 Weight loss  _1 Fever  _2 Chills Cardiac: _3 Chest pain   _4 Chest pressure   _5 Palpitations   _6 Shortness of breath when laying flat   _7 Shortness of breath at rest   _8 Shortness of breath with exertion. Vascular:  _9 Pain in legs with walking   _10 Pain in legs at rest   _11 Pain in legs when laying flat   _12 Claudication   _13 Pain in feet when walking  _14 Pain in feet at rest  _15 Pain in feet when laying flat   _16 History of DVT    _17 Phlebitis   _18 Swelling in legs   _19 Varicose veins   _20 Non-healing ulcers Pulmonary:   _21 Uses home oxygen   _22 Productive cough   _23 Hemoptysis   _24 Wheeze  _25 COPD   _26 Asthma Neurologic:  _27 Dizziness  _28 Blackouts   _29 Seizures   _30 History of stroke   _31 History of TIA  _32 Aphasia   _33 Temporary blindness   _34 Dysphagia   _35 Weakness or numbness in arms   _36 Weakness or numbness in legs Musculoskeletal:  _37 Arthritis   _38 Joint swelling   _39 Joint pain   _40 Low back pain Hematologic:  _41 Easy bruising  _42 Easy bleeding   _43 Hypercoagulable state   _44 Anemic  _45 Hepatitis Gastrointestinal:  _46 Blood in stool   _47 Vomiting blood  _48 Gastroesophageal reflux/heartburn   _49 Abdominal pain Genitourinary:  _50 Chronic kidney disease   _51 Difficult urination  _52 Frequent urination  _53 Burning with urination   _54 Hematuria Skin:  _55 Rashes   _56 Ulcers   _57 Wounds Psychological:  _58 History of anxiety   _59  History of major depression.    Physical Exam BP 126/84   Pulse 74   Ht _60  (1.676 m)   Wt 239 lb (108.4 kg)   LMP 10/02/2018 (Exact Date)   BMI 38.58 kg/m  Gen:  WD/WN, NAD Head: [place]/AT, No temporalis wasting.  Ear/Nose/Throat: Hearing grossly intact, nares w/o erythema or drainage, oropharynx w/o Erythema/Exudate Eyes: Conjunctiva clear, sclera non-icteric  Neck: trachea midline.  No JVD.  Pulmonary:  Good air movement, respirations not labored, no use of accessory muscles  Cardiac: RRR, no JVD Vascular:  Vessel Right Left  Radial Palpable Palpable                          DP  2+  1+  PT  1+  1+   Gastrointestinal:. No masses, surgical incisions, or scars. Musculoskeletal: M/S 5/5 throughout.  Extremities without ischemic changes.  The left leg has fairly noticeable atrophy from the right and is basically about half the size of the right.  The right has scattered varicosities but not a lot of stasis dermatitis.  Stasis dermatitis changes present on the left lower leg are moderate to severe. Neurologic:  Sensation grossly intact in extremities.  Symmetrical.  Speech is fluent. Motor exam as listed above. Psychiatric: Judgment intact, Mood & affect appropriate for pt's clinical situation. Dermatologic: No rashes or ulcers noted.  No cellulitis or open wounds.    Radiology US Venous Img Lower Unilateral Left (DVT)  Result Date: 03/04/2020 CLINICAL DATA:  Left lower leg swelling and pain. EXAM: LEFT LOWER EXTREMITY VENOUS DOPPLER ULTRASOUND  TECHNIQUE: Gray-scale sonography with compression, as well as color and duplex ultrasound, were performed to evaluate the deep venous system(s) from the level of the common femoral vein through the popliteal and proximal calf veins. COMPARISON:  None. FINDINGS: VENOUS Normal compressibility of the common femoral, superficial femoral, and popliteal veins, as well as the visualized calf veins. Visualized portions of profunda femoral vein and great saphenous vein unremarkable. No filling defects to suggest DVT on grayscale or color Doppler imaging. Doppler waveforms show normal direction of venous flow, normal respiratory plasticity and response to augmentation. Limited views of the contralateral common femoral vein are unremarkable. OTHER None. Limitations: none IMPRESSION: Negative. Electronically Signed   By: Marin Olp M.D.   On: 03/04/2020 11:06   US BREAST LTD UNI RIGHT INC AXILLA  Result Date: 03/09/2020 CLINICAL DATA:  Patient presents for evaluation of palpable abnormality within the upper-outer right breast. EXAM: DIGITAL DIAGNOSTIC BILATERAL MAMMOGRAM WITH CAD AND TOMO ULTRASOUND RIGHT BREAST COMPARISON:  None. ACR Breast Density Category b: There are scattered areas of fibroglandular density. FINDINGS: No concerning masses, calcifications or distortion identified within either breast. Mammographic images were processed with CAD. On physical exam, a small mobile nodule is palpated within the upper-outer right breast. Targeted ultrasound is performed, showing a  2.1 x 0.6 x 1.4 cm predominately hyperechoic oval mass within the right breast 9 o'clock position 10 cm from nipple at the patient reported site of palpable concern, potentially representing a lipoma. IMPRESSION: Probably benign palpable mass right breast favored to represent a lipoma. RECOMMENDATION: Continued clinical evaluation for right breast palpable area of concern. Right breast ultrasound in 6 months to reassess right breast mass 9 o'clock position. I have discussed the findings and recommendations with the patient. If applicable, a reminder letter will be sent to the patient regarding the next appointment. BI-RADS CATEGORY  3: Probably benign. Electronically Signed   By: Lovey Newcomer M.D.   On: 03/09/2020 11:56   MM DIAG BREAST TOMO BILATERAL  Result Date: 03/09/2020 CLINICAL DATA:  Patient presents for evaluation of palpable abnormality within the upper-outer right breast. EXAM: DIGITAL DIAGNOSTIC BILATERAL MAMMOGRAM WITH CAD AND TOMO ULTRASOUND RIGHT BREAST COMPARISON:  None. ACR Breast Density Category b: There are scattered areas of fibroglandular density. FINDINGS: No concerning masses, calcifications or distortion identified within either breast. Mammographic images were processed with CAD. On physical exam, a small mobile nodule is palpated within the upper-outer right breast. Targeted ultrasound is performed, showing a 2.1 x 0.6 x 1.4 cm predominately hyperechoic oval mass within the right breast 9 o'clock position 10 cm from nipple at the patient reported site of palpable concern, potentially representing a lipoma. IMPRESSION: Probably benign palpable mass right breast favored to represent a lipoma. RECOMMENDATION: Continued clinical evaluation for right breast palpable area of concern. Right breast ultrasound in 6 months to reassess right breast mass 9 o'clock position. I have discussed the findings and recommendations with the patient. If applicable, a reminder letter will be sent to the  patient regarding the next appointment. BI-RADS CATEGORY  3: Probably benign. Electronically Signed   By: Lovey Newcomer M.D.   On: 03/09/2020 11:56    Labs Recent Results (from the past 2160 hour(s))  CBC with Differential/Platelet     Status: Abnormal   Collection Time: 03/07/20 11:59 AM  Result Value Ref Range   WBC 11.7 (H) 3.4 - 10.8 x10E3/uL   RBC 4.94 3.77 - 5.28 x10E6/uL   Hemoglobin 14.7 11.1 - 15.9 g/dL  Hematocrit 43.8 34.0 - 46.6 %   MCV 89 79 - 97 fL   MCH 29.8 26.6 - 33.0 pg   MCHC 33.6 31.5 - 35.7 g/dL   RDW 13.1 11.7 - 15.4 %   Platelets 289 150 - 450 x10E3/uL   Neutrophils 62 Not Estab. %   Lymphs 30 Not Estab. %   Monocytes 6 Not Estab. %   Eos 2 Not Estab. %   Basos 0 Not Estab. %   Neutrophils Absolute 7.1 (H) 1.4 - 7.0 x10E3/uL   Lymphocytes Absolute 3.5 (H) 0.7 - 3.1 x10E3/uL   Monocytes Absolute 0.8 0.1 - 0.9 x10E3/uL   EOS (ABSOLUTE) 0.2 0.0 - 0.4 x10E3/uL   Basophils Absolute 0.1 0.0 - 0.2 x10E3/uL   Immature Granulocytes 0 Not Estab. %   Immature Grans (Abs) 0.0 0.0 - 0.1 x10E3/uL  Comprehensive metabolic panel     Status: Abnormal   Collection Time: 03/07/20 11:59 AM  Result Value Ref Range   Glucose 152 (H) 65 - 99 mg/dL   BUN 9 6 - 24 mg/dL   Creatinine, Ser 0.65 0.57 - 1.00 mg/dL   GFR calc non Af Amer 101 >59 mL/min/1.73   GFR calc Af Amer 116 >59 mL/min/1.73    Comment: **[facility] currently reports eGFR in compliance with the current**   recommendations of the [employer]. [facility] will   update reporting as new guidelines are published from the NKF-ASN   Task force.    BUN/Creatinine Ratio 14 9 - 23   Sodium 141 134 - 144 mmol/L   Potassium 4.2 3.5 - 5.2 mmol/L   Chloride 102 96 - 106 mmol/L   CO2 24 20 - 29 mmol/L   Calcium 9.5 8.7 - 10.2 mg/dL   Total Protein 7.2 6.0 - 8.5 g/dL   Albumin 4.1 3.8 - 4.9 g/dL   Globulin, Total 3.1 1.5 - 4.5 g/dL   Albumin/Globulin Ratio 1.3 1.2 - 2.2   Bilirubin Total 0.3 0.0 - 1.2 mg/dL    Alkaline Phosphatase 100 39 - 117 IU/L    Comment: **Effective Mar 20, 2020 Alkaline Phosphatase**   reference interval will be changing to:              Age                Female          Female           0 -  5 days         5 - 127       53 - 127           6 - 10 days         81 - 242       25 - 83          11 - 20 days        114 - 357      114 - 357          21 - 30 days        107 - 494      107 - 494           1 -  2 months      162 - 539      162 - 539           3 -  6 months      141 - 452  141 - 452           7 - 11 months      128 - 401      128 - 401   12 months -  6 years       170 - 369      170 - 369           7 - 12 years       161 - 409      161 - 409               13 years       43 - 435       49 - 227               14 years       121 - 375       75 - 161               15 years        72 - 279       19 - 134               16 years        24 - 207       31 - 121               17 years        85 - 161       55 - 113          46 - 20 years        97 - 125       77 - 106              >20 years         48 - 121       48 - 121    AST 17 0 - 40 IU/L   ALT 20 0 - 32 IU/L  Lipid panel     Status: Abnormal   Collection Time: 03/07/20 11:59 AM  Result Value Ref Range   Cholesterol, Total 195 100 - 199 mg/dL   Triglycerides 164 (H) 0 - 149 mg/dL   HDL 42 >39 mg/dL   VLDL Cholesterol Cal 29 5 - 40 mg/dL   LDL Chol Calc (NIH) 124 (H) 0 - 99 mg/dL   Chol/HDL Ratio 4.6 (H) 0.0 - 4.4 ratio    Comment:                                   T. Chol/HDL Ratio                                             Men  Women                               1/2 Avg.Risk  3.4    3.3                                   Avg.Risk  5.0    4.4  2X Avg.Risk  9.6    7.1                                3X Avg.Risk 23.4   11.0   TSH     Status: None   Collection Time: 03/07/20 11:59 AM  Result Value Ref Range   TSH 0.790 0.450 - 4.500 uIU/mL  Fe+TIBC+Fer     Status:  Abnormal   Collection Time: 03/07/20 11:59 AM  Result Value Ref Range   Total Iron Binding Capacity 323 250 - 450 ug/dL   UIBC 256 131 - 425 ug/dL   Iron 67 27 - 159 ug/dL   Iron Saturation 21 15 - 55 %   Ferritin 177 (H) 15 - 150 ng/mL  Hormone Panel     Status: None   Collection Time: 03/07/20 11:59 AM  Result Value Ref Range   TSH 0.79 uU/mL    Comment: Reference Range: Non-Pregnant Adult 0.450-4.500 Pregnancy First Trimester 0.100-4.000 Second Trimester 0.200-4.000 Third Trimester 0.300-4.500    T4 7.2 ug/dL    Comment: Reference Range: Adults: 4.2 - 13.0    Free T-3 3.3 pg/mL    Comment: Reference Range: >=20y: 2.0 - 4.4    Triiodothyronine (T-3), Serum 129 ng/dL    Comment: Reference Range: Adults: 55 - 170    Testosterone, Serum (Total) 25 ng/dL    Comment: This test was developed and its performance characteristics determined by [facility]. It has not been cleared or approved by the Food and Drug Administration. Reference Range: Adult Females   Premenopausal  10 - 55   Postmenopausal  7 - 40    Free Testosterone, Serum 4.0 pg/mL    Comment: Reference Range: Adult Females: 1.1 - 6.3    Testosterone-% Free 1.6 %    Comment: This test was developed and its performance characteristics determined by [facility]. It has not been cleared or approved by the Food and Drug Administration. Reference Range: Adult Females: 0.8 - 1.4    Follicle Stimulating Hormone 31 mIU/mL    Comment: This test was developed and its performance characteristics determined by [facility]. It has not been cleared or approved by the Food and Drug Administration. Reference Range: Follicular and Luteal: 1.8 - 11.2 Mid-cycle peak: 6 - 35 Postmenopausal: 30 - 120    Progesterone, Serum <10 ng/dL    Comment: This test was developed and its performance characteristics determined by [facility]. It has not been cleared or approved by the Food and Drug Administration. Reference Range: Adult Females  Cycle Days          Range    1 -  6        <10 - 17    7 - 12        <10 - 135   13 - 15        <10 - 1563   16 - 28        <10 - 2555 Post Menopausal    <10 Note: Luteal progesterone peaked from 350 to 3750 ng/dL on days ranging from 17 to 23.    DHEA-Sulfate, LCMS 32 ug/dL    Comment: This test was developed and its performance characteristics determined by [facility]. It has not been cleared or approved by the Food and Drug Administration. Reference Range: Adult Females (51 - 60y): <215    Sex Hormone Binding Globulin 25.7 nmol/L    Comment: Reference Range: >49y:  17.3 - 125.0    Estrone Sulfate 27 ng/dL    Comment: This test was developed and its performance characteristics determined by [facility]. It has not been cleared or approved by the Food and Drug Administration. Reference Range: Adult Females: Early Follicular (days 0-6)                 <09 - 407 Late Follicular (days 7-luteal)                  15 - 390 Luteal          <10 - 373 Post Menopausal <10 - 69    Estradiol, Serum, MS 12 pg/mL    Comment: This test was developed and its performance characteristics determined by [facility]. It has not been cleared or approved by the Food and Drug Administration. Reference Range: Adult Females  Follicular: 30 - 680  Luteal: 70 - 300  Postmenopausal: <15   B12     Status: None   Collection Time: 03/07/20 11:59 AM  Result Value Ref Range   Vitamin B-12 251 232 - 1,245 pg/mL  Hemoglobin A1c     Status: Abnormal   Collection Time: 03/07/20 11:59 AM  Result Value Ref Range   Hgb A1c MFr Bld 9.1 (H) 4.8 - 5.6 %    Comment:          Prediabetes: 5.7 - 6.4          Diabetes: >6.4          Glycemic control for adults with diabetes: <7.0    Est. average glucose Bld gHb Est-mCnc 214 mg/dL  Specimen status report     Status: None   Collection Time: 03/07/20 11:59 AM  Result Value Ref Range   specimen status report Comment     Comment: Written Authorization Written  Authorization Written Authorization Received. Authorization received from [place] 03-09-2020 Logged by Pecolia Ades   POCT UA - Microalbumin     Status: Abnormal   Collection Time: 03/07/20 12:08 PM  Result Value Ref Range   Microalbumin Ur, POC 20 mg/L   Creatinine, POC     Albumin/Creatinine Ratio, Urine, POC      Assessment/Plan:  Controlled type 2 diabetes mellitus with complication, without long-term current use of insulin (HCC) blood glucose control important in reducing the progression of atherosclerotic disease. Also, involved in wound healing. On appropriate medications.   Pain in limb  Recommend:  The patient has atypical pain symptoms for pure atherosclerotic disease. However, on physical exam there is evidence of mixed venous and arterial disease, given the diminished pulses and the edema associated with venous changes of the legs.  Noninvasive studies including ABI's and venous ultrasound of the legs will be obtained and the patient will follow up with me to review these studies.  I suspect the patient is c/o pseudoclaudication.  Patient should have an evaluation of his LS spine which I defer to the primary service.  The patient should continue walking and begin a more formal exercise program. The patient should continue his antiplatelet therapy and aggressive treatment of the lipid abnormalities.  The patient should begin wearing graduated compression socks 15-20 mmHg strength to control edema.   Stasis dermatitis The patient has fairly pronounced stasis dermatitis changes on the left leg.  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  It is very possible there is some role of circulatory dysfunction associated with this as well.  ABIs  and reflux study will be done in the near future at her convenience.  We will see her back following the studies.      Leotis Pain 03/21/2020, 12:24 PM   This note was created with Dragon  medical transcription system.  Any errors from dictation are unintentional.

## 2020-03-21 NOTE — Assessment & Plan Note (Signed)
Recommend:  The patient has atypical pain symptoms for pure atherosclerotic disease. However, on physical exam there is evidence of mixed venous and arterial disease, given the diminished pulses and the edema associated with venous changes of the legs.  Noninvasive studies including ABI's and venous ultrasound of the legs will be obtained and the patient will follow up with me to review these studies.  I suspect the patient is c/o pseudoclaudication.  Patient should have an evaluation of his LS spine which I defer to the primary service.  The patient should continue walking and begin a more formal exercise program. The patient should continue his antiplatelet therapy and aggressive treatment of the lipid abnormalities.  The patient should begin wearing graduated compression socks 15-20 mmHg strength to control edema.  

## 2020-03-21 NOTE — Patient Instructions (Signed)
Peripheral Vascular Disease  Peripheral vascular disease (PVD) is a disease of the blood vessels that are not part of your heart and brain. A simple term for PVD is poor circulation. In most cases, PVD narrows the blood vessels that carry blood from your heart to the rest of your body. This can reduce the supply of blood to your arms, legs, and internal organs, like your stomach or kidneys. However, PVD most often affects a person's lower legs and feet. Without treatment, PVD tends to get worse. PVD can also lead to acute ischemic limb. This is when an arm or leg suddenly cannot get enough blood. This is a medical emergency. Follow these instructions at home: Lifestyle  Do not use any products that contain nicotine or tobacco, such as cigarettes and e-cigarettes. If you need help quitting, ask your doctor.  Lose weight if you are overweight. Or, stay at a healthy weight as told by your doctor.  Eat a diet that is low in fat and cholesterol. If you need help, ask your doctor.  Exercise regularly. Ask your doctor for activities that are right for you. General instructions  Take over-the-counter and prescription medicines only as told by your doctor.  Take good care of your feet: ? Wear comfortable shoes that fit well. ? Check your feet often for any cuts or sores.  Keep all follow-up visits as told by your doctor This is important. Contact a doctor if:  You have cramps in your legs when you walk.  You have leg pain when you are at rest.  You have coldness in a leg or foot.  Your skin changes.  You are unable to get or have an erection (erectile dysfunction).  You have cuts or sores on your feet that do not heal. Get help right away if:  Your arm or leg turns cold, numb, and blue.  Your arms or legs become red, warm, swollen, painful, or numb.  You have chest pain.  You have trouble breathing.  You suddenly have weakness in your face, arm, or leg.  You become very  confused or you cannot speak.  You suddenly have a very bad headache.  You suddenly cannot see. Summary  Peripheral vascular disease (PVD) is a disease of the blood vessels.  A simple term for PVD is poor circulation. Without treatment, PVD tends to get worse.  Treatment may include exercise, low fat and low cholesterol diet, and quitting smoking. This information is not intended to replace advice given to you by your health care provider. Make sure you discuss any questions you have with your health care provider. Document Revised: 10/03/2017 Document Reviewed: 11/28/2016 Elsevier Patient Education  2020 [employer]  

## 2020-03-22 ENCOUNTER — Ambulatory Visit
Admission: RE | Admit: 2020-03-22 | Discharge: 2020-03-22 | Disposition: A | Discharge status: Home / Self Care | Payer: BC Managed Care – PPO | Attending: Adult Health | Admitting: Adult Health

## 2020-03-22 ENCOUNTER — Ambulatory Visit
Admission: RE | Admit: 2020-03-22 | Discharge: 2020-03-22 | Disposition: A | Discharge status: Home / Self Care | Payer: BC Managed Care – PPO | Source: Ambulatory Visit | Attending: Adult Health | Admitting: Adult Health

## 2020-03-22 DIAGNOSIS — M79662 Pain in left lower leg: Secondary | ICD-10-CM | POA: Diagnosis not present

## 2020-03-22 DIAGNOSIS — M79661 Pain in right lower leg: Secondary | ICD-10-CM | POA: Insufficient documentation

## 2020-03-22 DIAGNOSIS — M545 Low back pain: Secondary | ICD-10-CM | POA: Diagnosis not present

## 2020-03-22 NOTE — Progress Notes (Signed)
X ray of lumbar spine shows no acute abnormality. She should have a DEXA bone scan done at some point if she has never had to look for osteoporosis.

## 2020-03-31 ENCOUNTER — Encounter (INDEPENDENT_AMBULATORY_CARE_PROVIDER_SITE_OTHER): Payer: Self-pay | Admitting: Nurse Practitioner

## 2020-03-31 ENCOUNTER — Ambulatory Visit (INDEPENDENT_AMBULATORY_CARE_PROVIDER_SITE_OTHER): Payer: BC Managed Care – PPO | Admitting: Nurse Practitioner

## 2020-03-31 ENCOUNTER — Other Ambulatory Visit: Payer: Self-pay

## 2020-03-31 ENCOUNTER — Ambulatory Visit (INDEPENDENT_AMBULATORY_CARE_PROVIDER_SITE_OTHER): Payer: BC Managed Care – PPO

## 2020-03-31 VITALS — BP 143/85 | HR 91 | Ht 66.0 in | Wt 239.0 lb

## 2020-03-31 DIAGNOSIS — M79605 Pain in left leg: Secondary | ICD-10-CM | POA: Diagnosis not present

## 2020-03-31 DIAGNOSIS — I872 Venous insufficiency (chronic) (peripheral): Secondary | ICD-10-CM

## 2020-04-04 ENCOUNTER — Encounter (INDEPENDENT_AMBULATORY_CARE_PROVIDER_SITE_OTHER): Payer: Self-pay | Admitting: Nurse Practitioner

## 2020-04-04 NOTE — Progress Notes (Signed)
Subjective:    Patient ID: Kristin Cherry, female    DOB: 1966/10/03, 54 y.o.   MRN: [id number] Chief Complaint  Patient presents with  . Follow-up    U/S  follow up    Kristin Cherry is a 54 y.o. female.  The patient returns today for evaluation of marked discoloration as well as weakness and pain in her bilateral lower extremities.  The patient reports the pain as a tingling/prickling pain.  She does have swelling sometimes in the evenings after standing on her feet for a long time.  However she does note that the swelling is not consistent and she can go months without having any swelling.  However the prickly tingling sensation is more consistent and on a daily basis.  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  This has been since birth.  She denies any trauma or injury to her lower extremities.  She denies any typical claudication or rest pain like symptoms.  She notes that the left lower extremity has worsened discoloration over the last few years.  She denies any previous DVT or superficial thrombophlebitis.   Today noninvasive studies show she has an ABI of 1.13 on the right and 1.05 on the left.  Her bilateral TBI's are greater than 0.70.  The patient has triphasic tibial artery waveforms bilaterally.  The patient has strong toe waveforms bilaterally.  The patient underwent a left lower extremity venous reflux study which showed no evidence of DVT or superficial venous thrombosis.  The patient has no evidence of deep venous insufficiency in the left lower extremity.  The patient has no evidence of superficial venous reflux seen in the great or short saphenous vein.   Review of Systems  Cardiovascular: Positive for leg swelling.  Neurological:       Tingling, prickling  All other systems reviewed and are negative.      Objective:   Physical Exam Vitals reviewed.  Cardiovascular:     Rate and Rhythm: Normal rate and regular rhythm.    Pulmonary:     Effort: Pulmonary effort is normal.     Breath sounds: Normal breath sounds.  Skin:    General: Skin is warm and dry.  Neurological:     Mental Status: She is alert and oriented to person, place, and time.  Psychiatric:        Mood and Affect: Mood normal.        Behavior: Behavior normal.        Thought Content: Thought content normal.        Judgment: Judgment normal.     BP (!) 143/85   Pulse 91   Ht 5\' 6"  (1.676 m)   Wt 239 lb (108.4 kg)   LMP 10/02/2018 (Exact Date)   BMI 38.58 kg/m   Past Medical History:  Diagnosis Date  . Anemia   . Asthma   . Diabetes mellitus without complication ([place])   . History of bronchitis   . Hyperlipidemia   . Hypertension   . Obesity     Social History   Socioeconomic History  . Marital status: Single    Spouse name: Not on file  . Number of children: Not on file  . Years of education: Not on file  . Highest education level: Not on file  Occupational History  . Not on file  Tobacco Use  . Smoking status: Current Every Day Smoker    Packs/day: 0.50  Types: Cigarettes, E-cigarettes  . Smokeless tobacco: Never Used  Substance and Sexual Activity  . Alcohol use: Not Currently    Comment: rarely  . Drug use: No  . Sexual activity: Yes    Birth control/protection: Post-menopausal  Other Topics Concern  . Not on file  Social History Narrative  . Not on file   Social Determinants of Health   Financial Resource Strain:   . Difficulty of Paying Living Expenses:   Food Insecurity:   . Worried About Charity fundraiser in the Last Year:   . Arboriculturist in the Last Year:   Transportation Needs:   . Film/video editor (Medical):   Marland Kitchen Lack of Transportation (Non-Medical):   Physical Activity:   . Days of Exercise per Week:   . Minutes of Exercise per Session:   Stress:   . Feeling of Stress :   Social Connections:   . Frequency of Communication with Friends and Family:   . Frequency of Social  Gatherings with Friends and Family:   . Attends Religious Services:   . Active Member of Clubs or Organizations:   . Attends Archivist Meetings:   Marland Kitchen Marital Status:   Intimate Partner Violence:   . Fear of Current or Ex-Partner:   . Emotionally Abused:   Marland Kitchen Physically Abused:   . Sexually Abused:     Past Surgical History:  Procedure Laterality Date  . CHOLECYSTECTOMY    . COLONOSCOPY WITH PROPOFOL N/A 12/26/2017   Procedure: COLONOSCOPY WITH PROPOFOL;  Surgeon: Manya Silvas, MD;  Location: [facility] ENDOSCOPY;  Service: Endoscopy;  Laterality: N/A;  . KNEE SURGERY     slow the growth of right knee patient reports in 1979 and left knee cartilage repair in 2000  . SALPINGOOPHORECTOMY Right     Family History  Problem Relation Age of Onset  . Cancer Mother   . Hypertension Mother   . Hypertension Father   . Cancer - Other Father   . Congestive Heart Failure Father   . Cancer Sister   . Diabetes Sister   . Hypertension Sister   . Cancer Brother   . Hypertension Brother   . Diabetes Maternal Grandmother   . Congestive Heart Failure Paternal Grandmother   . Hypertension Paternal Grandfather   . Diabetes Sister     Allergies  Allergen Reactions  . Lodine [Etodolac] Hives  . Penicillins Hives       Assessment & Plan:   1. Pain of left lower extremity Recommend:  I do not find evidence of Vascular pathology that would explain the patient's symptoms  The patient has atypical pain symptoms for vascular disease  I do not find evidence of Vascular pathology that would explain the patient's symptoms and I suspect the patient is c/o pseudoclaudication or restless leg syndrome.    Noninvasive studies including venous ultrasound of the legs do not identify vascular problems  The patient should continue walking and begin a more formal exercise program. The patient should continue his antiplatelet therapy and aggressive treatment of the lipid abnormalities. The  patient should begin wearing graduated compression socks 15-20 mmHg strength to control her mild edema.  Patient will follow-up with me on a PRN basis  Further work-up of her lower extremity pain is deferred to the primary service     2. Venous stasis dermatitis of left lower extremity The patient does have pronounced stasis dermatitis of the left lower extremity, however today noninvasive studies have  ruled out any circulatory dysfunction that could possibly be associated.  The patient will follow up with [place] on an as-needed basis.   Current Outpatient Medications on File Prior to Visit  Medication Sig Dispense Refill  . albuterol (PROVENTIL) (2.5 MG/3ML) 0.083% nebulizer solution Take 2.5 mg by nebulization every 6 (six) hours as needed for wheezing or shortness of breath.    . gabapentin (NEURONTIN) 100 MG capsule     . hydrochlorothiazide (HYDRODIURIL) 25 MG tablet Take 25 mg by mouth daily.    Marland Kitchen JANUMET XR 50-1000 MG TB24 Take 2 tablets by mouth daily.    Marland Kitchen lisinopril (ZESTRIL) 20 MG tablet Take 20 mg by mouth daily.    . meloxicam (MOBIC) 15 MG tablet Take 1 tablet (15 mg total) by mouth daily. Take with food. 30 tablet 0  . methocarbamol (ROBAXIN) 750 MG tablet Take 1 tablet (750 mg total) by mouth 4 (four) times daily. 21 tablet 0  . OZEMPIC, 1 MG/DOSE, 2 MG/1.5ML SOPN INJECT 1MG  DOSE SUBCUTANEOUSLY WEEKLY. 4 pen 0  . fluticasone (FLONASE) 50 MCG/ACT nasal spray Place 1 spray into both nostrils daily for 7 days. 16 g 2  . [DISCONTINUED] metFORMIN (GLUCOPHAGE) 1000 MG tablet Take 1,000 mg by mouth 2 (two) times daily with a meal.     No current facility-administered medications on file prior to visit.    There are no Patient Instructions on file for this visit. No follow-ups on file.   Kris Hartmann, NP

## 2020-04-12 ENCOUNTER — Other Ambulatory Visit: Payer: Self-pay | Admitting: Adult Health

## 2020-04-12 ENCOUNTER — Telehealth: Payer: Self-pay | Admitting: Adult Health

## 2020-04-12 MED ORDER — GABAPENTIN 100 MG PO CAPS: 100.0000 mg | ORAL_CAPSULE | Freq: Two times a day (BID) | ORAL | 1 refills | Status: DC

## 2020-04-12 NOTE — Telephone Encounter (Signed)
Medication is marked historical in chart please review if appropriate to fill. KW

## 2020-04-12 NOTE — Telephone Encounter (Signed)
Provider refilled

## 2020-04-12 NOTE — Telephone Encounter (Signed)
[facility] is requesting refills on Gabapentin 100 mg.

## 2020-04-12 NOTE — Progress Notes (Signed)
Meds ordered this encounter  Medications   gabapentin (NEURONTIN) 100 MG capsule    Sig: Take 1 capsule (100 mg total) by mouth 2 (two) times daily.    Dispense:  60 capsule    Refill:  1  refill requested.

## 2020-04-18 DIAGNOSIS — E1142 Type 2 diabetes mellitus with diabetic polyneuropathy: Secondary | ICD-10-CM | POA: Diagnosis not present

## 2020-04-18 DIAGNOSIS — E1159 Type 2 diabetes mellitus with other circulatory complications: Secondary | ICD-10-CM | POA: Diagnosis not present

## 2020-04-18 DIAGNOSIS — E1169 Type 2 diabetes mellitus with other specified complication: Secondary | ICD-10-CM | POA: Diagnosis not present

## 2020-04-18 DIAGNOSIS — Z720 Tobacco use: Secondary | ICD-10-CM | POA: Diagnosis not present

## 2020-04-18 DIAGNOSIS — E785 Hyperlipidemia, unspecified: Secondary | ICD-10-CM | POA: Insufficient documentation

## 2020-05-04 ENCOUNTER — Other Ambulatory Visit: Payer: Self-pay | Admitting: Adult Health

## 2020-05-04 MED ORDER — LISINOPRIL 20 MG PO TABS: 20.0000 mg | ORAL_TABLET | Freq: Every day | ORAL | 1 refills | Status: DC

## 2020-05-04 MED ORDER — HYDROCHLOROTHIAZIDE 25 MG PO TABS: 25.0000 mg | ORAL_TABLET | Freq: Every day | ORAL | 1 refills | Status: DC

## 2020-05-04 NOTE — Telephone Encounter (Signed)
[facility] faxed refill request for the following medications:  1. lisinopril (ZESTRIL) 20 MG tablet 2. hydrochlorothiazide (HYDRODIURIL) 25 MG tablet  90 day supply Last Rx: Our office hasn't provided this for pt before. LOV: 03/07/2020 NOV: 06/07/2020 Please advise. Thanks TNP

## 2020-05-11 ENCOUNTER — Ambulatory Visit
Admission: RE | Admit: 2020-05-11 | Discharge: 2020-05-11 | Disposition: A | Discharge status: Home / Self Care | Payer: BC Managed Care – PPO | Source: Ambulatory Visit | Attending: Adult Health | Admitting: Adult Health

## 2020-05-11 DIAGNOSIS — Z13820 Encounter for screening for osteoporosis: Secondary | ICD-10-CM | POA: Insufficient documentation

## 2020-05-11 DIAGNOSIS — Z780 Asymptomatic menopausal state: Secondary | ICD-10-CM | POA: Diagnosis not present

## 2020-05-12 NOTE — Progress Notes (Signed)
Normal bone density scan

## 2020-06-07 ENCOUNTER — Ambulatory Visit: Payer: Self-pay | Admitting: Adult Health

## 2020-06-14 ENCOUNTER — Ambulatory Visit: Payer: Self-pay | Admitting: Adult Health

## 2020-06-29 ENCOUNTER — Other Ambulatory Visit: Payer: Self-pay | Admitting: Adult Health

## 2020-06-29 NOTE — Telephone Encounter (Signed)
Requested Prescriptions  Pending Prescriptions Disp Refills   OZEMPIC, 1 MG/DOSE, 2 MG/1.5ML SOPN [Pharmacy Med Name: OZEMPIC 1 MG/DOSE (2 MG/1.5ML)]  1    Sig: INJECT 1 MG DOSE SUBCUTANEOUSLY WEEKLY.     Endocrinology:  Diabetes - GLP-1 Receptor Agonists Failed - 06/29/2020  1:17 AM      Failed - HBA1C is between 0 and 7.9 and within 180 days    Hgb A1c MFr Bld  Date Value Ref Range Status  03/07/2020 9.1 (H) 4.8 - 5.6 % Final    Comment:             Prediabetes: 5.7 - 6.4          Diabetes: >6.4          Glycemic control for adults with diabetes: <7.0          Passed - Valid encounter within last 6 months    Recent Outpatient Visits          3 months ago Osteoporosis screening   [place], Kelby Aline, FNP      Future Appointments            In 2 weeks Flinch[place], Kelby Aline, [place], [place]

## 2020-07-04 ENCOUNTER — Ambulatory Visit: Payer: Self-pay | Admitting: Adult Health

## 2020-07-19 ENCOUNTER — Ambulatory Visit: Payer: Self-pay | Admitting: Adult Health

## 2020-07-27 ENCOUNTER — Encounter: Payer: Self-pay | Admitting: Adult Health

## 2020-07-27 ENCOUNTER — Ambulatory Visit: Payer: Self-pay | Admitting: Adult Health

## 2020-07-27 ENCOUNTER — Other Ambulatory Visit: Payer: Self-pay

## 2020-07-27 ENCOUNTER — Ambulatory Visit (INDEPENDENT_AMBULATORY_CARE_PROVIDER_SITE_OTHER): Payer: BC Managed Care – PPO | Admitting: Adult Health

## 2020-07-27 VITALS — BP 144/89 | HR 84 | Temp 98.5°F | Resp 16 | Wt 239.8 lb

## 2020-07-27 DIAGNOSIS — Z716 Tobacco abuse counseling: Secondary | ICD-10-CM | POA: Diagnosis not present

## 2020-07-27 DIAGNOSIS — E118 Type 2 diabetes mellitus with unspecified complications: Secondary | ICD-10-CM

## 2020-07-27 DIAGNOSIS — L659 Nonscarring hair loss, unspecified: Secondary | ICD-10-CM

## 2020-07-27 DIAGNOSIS — Z23 Encounter for immunization: Secondary | ICD-10-CM

## 2020-07-27 DIAGNOSIS — R7989 Other specified abnormal findings of blood chemistry: Secondary | ICD-10-CM | POA: Diagnosis not present

## 2020-07-27 LAB — POCT GLYCOSYLATED HEMOGLOBIN (HGB A1C): Hemoglobin A1C: 8.6 % — AB (ref 4.0–5.6)

## 2020-07-27 MED ORDER — GABAPENTIN 100 MG PO CAPS: 100.0000 mg | ORAL_CAPSULE | Freq: Three times a day (TID) | ORAL | 1 refills | Status: DC

## 2020-07-27 MED ORDER — ALBUTEROL SULFATE HFA 108 (90 BASE) MCG/ACT IN AERS
2.0000 | INHALATION_SPRAY | Freq: Four times a day (QID) | RESPIRATORY_TRACT | 2 refills | Status: DC | PRN
Start: 2020-07-27 — End: 2023-02-28

## 2020-07-27 MED ORDER — BUPROPION HCL ER (XL) 300 MG PO TB24: 300.0000 mg | ORAL_TABLET | Freq: Every day | ORAL | 1 refills | Status: DC

## 2020-07-27 NOTE — Patient Instructions (Addendum)
For first week on Wellbutrin take 1/2 of a tablet (150mg ) once daily by mouth and then can increase to full tablet 300 mg once daily by mouth the next week if tolerating ok.    Steps to Quit Smoking Smoking tobacco is the leading cause of preventable death. It can affect almost every organ in the body. Smoking puts you and those around you at risk for developing many serious chronic diseases. Quitting smoking can be difficult, but it is one of the best things that you can do for your health. It is never too late to quit. How do I get ready to quit? When you decide to quit smoking, create a plan to help you succeed. Before you quit:  Pick a date to quit. Set a date within the next 2 weeks to give you time to prepare.  Write down the reasons why you are quitting. Keep this list in places where you will see it often.  Tell your family, friends, and co-workers that you are quitting. Support from your loved ones can make quitting easier.  Talk with your health care provider about your options for quitting smoking.  Find out what treatment options are covered by your health insurance.  Identify people, places, things, and activities that make you want to smoke (triggers). Avoid them. What first steps can I take to quit smoking?  Throw away all cigarettes at home, at work, and in your car.  Throw away smoking accessories, such as Scientist, research (medical).  Clean your car. Make sure to empty the ashtray.  Clean your home, including curtains and carpets. What strategies can I use to quit smoking? Talk with your health care provider about combining strategies, such as taking medicines while you are also receiving in-person counseling. Using these two strategies together makes you more likely to succeed in quitting than if you used either strategy on its own.  If you are pregnant or breastfeeding, talk with your health care provider about finding counseling or other support strategies to quit  smoking. Do not take medicine to help you quit smoking unless your health care provider tells you to do so. To quit smoking: Quit right away  Quit smoking completely, instead of gradually reducing how much you smoke over a period of time. Research shows that stopping smoking right away is more successful than gradually quitting.  Attend in-person counseling to help you build problem-solving skills. You are more likely to succeed in quitting if you attend counseling sessions regularly. Even short sessions of 10 minutes can be effective. Take medicine You may take medicines to help you quit smoking. Some medicines require a prescription and some you can purchase over-the-counter. Medicines may have nicotine in them to replace the nicotine in cigarettes. Medicines may:  Help to stop cravings.  Help to relieve withdrawal symptoms. Your health care provider may recommend:  Nicotine patches, gum, or lozenges.  Nicotine inhalers or sprays.  Non-nicotine medicine that is taken by mouth. Find resources Find resources and support systems that can help you to quit smoking and remain smoke-free after you quit. These resources are most helpful when you use them often. They include:  Online chats with a Social worker.  Telephone quitlines.  Printed Furniture conservator/restorer.  Support groups or group counseling.  Text messaging programs.  Mobile phone apps or applications. Use apps that can help you stick to your quit plan by providing reminders, tips, and encouragement. There are many free apps for mobile devices as well as websites. Examples  include Quit Guide from the [employer] and smokefree.gov What things can I do to make it easier to quit?   Reach out to your family and friends for support and encouragement. Call telephone quitlines (1-800-QUIT-NOW), reach out to support groups, or work with a counselor for support.  Ask people who smoke to avoid smoking around you.  Avoid places that trigger you to  smoke, such as bars, parties, or smoke-break areas at work.  Spend time with people who do not smoke.  Lessen the stress in your life. Stress can be a smoking trigger for some people. To lessen stress, try: ? Exercising regularly. ? Doing deep-breathing exercises. ? Doing yoga. ? Meditating. ? Performing a body scan. This involves closing your eyes, scanning your body from head to toe, and noticing which parts of your body are particularly tense. Try to relax the muscles in those areas. How will I feel when I quit smoking? Day 1 to 3 weeks Within the first 24 hours of quitting smoking, you may start to feel withdrawal symptoms. These symptoms are usually most noticeable 2-3 days after quitting, but they usually do not last for more than 2-3 weeks. You may experience these symptoms:  Mood swings.  Restlessness, anxiety, or irritability.  Trouble concentrating.  Dizziness.  Strong cravings for sugary foods and nicotine.  Mild weight gain.  Constipation.  Nausea.  Coughing or a sore throat.  Changes in how the medicines that you take for unrelated issues work in your body.  Depression.  Trouble sleeping (insomnia). Week 3 and afterward After the first 2-3 weeks of quitting, you may start to notice more positive results, such as:  Improved sense of smell and taste.  Decreased coughing and sore throat.  Slower heart rate.  Lower blood pressure.  Clearer skin.  The ability to breathe more easily.  Fewer sick days. Quitting smoking can be very challenging. Do not get discouraged if you are not successful the first time. Some people need to make many attempts to quit before they achieve long-term success. Do your best to stick to your quit plan, and talk with your health care provider if you have any questions or concerns. Summary  Smoking tobacco is the leading cause of preventable death. Quitting smoking is one of the best things that you can do for your  health.  When you decide to quit smoking, create a plan to help you succeed.  Quit smoking right away, not slowly over a period of time.  When you start quitting, seek help from your health care provider, family, or friends. This information is not intended to replace advice given to you by your health care provider. Make sure you discuss any questions you have with your health care provider. Document Revised: 07/16/2019 Document Reviewed: 01/09/2019 Elsevier Patient Education  [place].   Bupropion extended-release tablets (Depression/Mood Disorders) What is this medicine? BUPROPION (byoo PROE pee on) is used to treat depression. This medicine may be used for other purposes; ask your health care provider or pharmacist if you have questions. COMMON BRAND NAME(S): Aplenzin, Budeprion XL, Forfivo XL, Wellbutrin XL What should I tell my health care provider before I take this medicine? They need to know if you have any of these conditions:  an eating disorder, such as anorexia or bulimia  bipolar disorder or psychosis  diabetes or high blood sugar, treated with medication  glaucoma  head injury or brain tumor  heart disease, previous heart attack, or irregular heart beat  high blood pressure  kidney or liver disease  seizures (convulsions)  suicidal thoughts or a previous suicide attempt  Tourette's syndrome  weight loss  an unusual or allergic reaction to bupropion, other medicines, foods, dyes, or preservatives  breast-feeding  pregnant or trying to become pregnant How should I use this medicine? Take this medicine by mouth with a glass of water. Follow the directions on the prescription label. You can take it with or without food. If it upsets your stomach, take it with food. Do not crush, chew, or cut these tablets. This medicine is taken once daily at the same time each day. Do not take your medicine more often than directed. Do not stop taking this  medicine suddenly except upon the advice of your doctor. Stopping this medicine too quickly may cause serious side effects or your condition may worsen. A special MedGuide will be given to you by the pharmacist with each prescription and refill. Be sure to read this information carefully each time. Talk to your pediatrician regarding the use of this medicine in children. Special care may be needed. Overdosage: If you think you have taken too much of this medicine contact a poison control center or emergency room at once. NOTE: This medicine is only for you. Do not share this medicine with others. What if I miss a dose? If you miss a dose, skip the missed dose and take your next tablet at the regular time. Do not take double or extra doses. What may interact with this medicine? Do not take this medicine with any of the following medications:  linezolid  MAOIs like Azilect, Carbex, Eldepryl, Marplan, Nardil, and Parnate  methylene blue (injected into a vein)  other medicines that contain bupropion like Zyban This medicine may also interact with the following medications:  alcohol  certain medicines for anxiety or sleep  certain medicines for blood pressure like metoprolol, propranolol  certain medicines for depression or psychotic disturbances  certain medicines for HIV or AIDS like efavirenz, lopinavir, nelfinavir, ritonavir  certain medicines for irregular heart beat like propafenone, flecainide  certain medicines for Parkinson's disease like amantadine, levodopa  certain medicines for seizures like carbamazepine, phenytoin, phenobarbital  cimetidine  clopidogrel  cyclophosphamide  digoxin  furazolidone  isoniazid  nicotine  orphenadrine  procarbazine  steroid medicines like prednisone or cortisone  stimulant medicines for attention disorders, weight loss, or to stay awake  tamoxifen  theophylline  thiotepa  ticlopidine  tramadol  warfarin This list  may not describe all possible interactions. Give your health care provider a list of all the medicines, herbs, non-prescription drugs, or dietary supplements you use. Also tell them if you smoke, drink alcohol, or use illegal drugs. Some items may interact with your medicine. What should I watch for while using this medicine? Tell your doctor if your symptoms do not get better or if they get worse. Visit your doctor or healthcare provider for regular checks on your progress. Because it may take several weeks to see the full effects of this medicine, it is important to continue your treatment as prescribed by your doctor. This medicine may cause serious skin reactions. They can happen weeks to months after starting the medicine. Contact your healthcare provider right away if you notice fevers or flu-like symptoms with a rash. The rash may be red or purple and then turn into blisters or peeling of the skin. Or, you might notice a red rash with swelling of the face, lips or lymph nodes in  your neck or under your arms. Patients and their families should watch out for new or worsening thoughts of suicide or depression. Also watch out for sudden changes in feelings such as feeling anxious, agitated, panicky, irritable, hostile, aggressive, impulsive, severely restless, overly excited and hyperactive, or not being able to sleep. If this happens, especially at the beginning of treatment or after a change in dose, call your healthcare provider. Avoid alcoholic drinks while taking this medicine. Drinking large amounts of alcoholic beverages, using sleeping or anxiety medicines, or quickly stopping the use of these agents while taking this medicine may increase your risk for a seizure. Do not drive or use heavy machinery until you know how this medicine affects you. This medicine can impair your ability to perform these tasks. Do not take this medicine close to bedtime. It may prevent you from sleeping. Your mouth may  get dry. Chewing sugarless gum or sucking hard candy, and drinking plenty of water may help. Contact your doctor if the problem does not go away or is severe. The tablet shell for some brands of this medicine does not dissolve. This is normal. The tablet shell may appear whole in the stool. This is not a cause for concern. What side effects may I notice from receiving this medicine? Side effects that you should report to your doctor or health care professional as soon as possible:  allergic reactions like skin rash, itching or hives, swelling of the face, lips, or tongue  breathing problems  changes in vision  confusion  elevated mood, decreased need for sleep, racing thoughts, impulsive behavior  fast or irregular heartbeat  hallucinations, loss of contact with reality  increased blood pressure  rash, fever, and swollen lymph nodes  redness, blistering, peeling or loosening of the skin, including inside the mouth  seizures  suicidal thoughts or other mood changes  unusually weak or tired  vomiting Side effects that usually do not require medical attention (report to your doctor or health care professional if they continue or are bothersome):  constipation  headache  loss of appetite  nausea  tremors  weight loss This list may not describe all possible side effects. Call your doctor for medical advice about side effects. You may report side effects to FDA at 1-800-FDA-1088. Where should I keep my medicine? Keep out of the reach of children. Store at room temperature between 15 and 30 degrees C (59 and 86 degrees F). Throw away any unused medicine after the expiration date. NOTE: This sheet is a summary. It may not cover all possible information. If you have questions about this medicine, talk to your doctor, pharmacist, or health care provider.  2020 Elsevier/Gold Standard (2019-01-14 13:45:31) Bupropion sustained-release tablets (smoking cessation) What is this  medicine? BUPROPION (byoo PROE pee on) is used to help people quit smoking. This medicine may be used for other purposes; ask your health care provider or pharmacist if you have questions. COMMON BRAND NAME(S): Buproban, Zyban  What should I tell my health care provider before I take this medican eating disorder, such as anorexia or bulimia  bipolar disorder or psychosis diabetes or high blood sugar, treated with medicationine? They need to know if you have any of these conditions:    glaucoma  head injury or brain tumor  heart disease, previous heart attack, or irregular heart beat  high blood pressure  kidney or liver disease  seizures  suicidal thoughts or a previous suicide attempt  Tourette's syndrome  weight loss  an unusual or allergic reaction to bupropion, other medicines, foods, dyes, or preservatives  breast-feeding  pregnant or trying to become pregnant How should I use this medicine? Take this medicine by mouth with a glass of water. Follow the directions on the prescription label. You can take it with or without food. If it upsets your stomach, take it with food. Do not cut, crush or chew this medicine. Take your medicine at regular intervals. If you take this medicine more than once a day, take your second dose at least 8 hours after you take your first dose. To limit difficulty in sleeping, avoid taking this medicine at bedtime. Do not take your medicine more often than directed. Do not stop taking this medicine suddenly except upon the advice of your doctor. Stopping this medicine too quickly may cause serious side effects. A special MedGuide will be given to you by the pharmacist with each prescription and refill. Be sure to read this information carefully each time. Talk to your pediatrician regarding the use of this medicine in children. Special care may be needed. Overdosage: If you think you have taken too much of this medicine contact a poison control  center or emergency room at once. NOTE: This medicine is only for you. Do not share this medicine with others. What if I miss a dose? If you miss a dose, skip the missed dose and take your next tablet at the regular time. There should be at least 8 hours between doses. Do not take double or extra doses. What may interact with this medicine? Do not take this medicine with any of the following medications:  linezolid  MAOIs like Azilect, Carbex, Eldepryl, Marplan, Nardil, and Parnate  methylene blue (injected into a vein)  other medicines that contain bupropion like Wellbutrin This medicine may also interact with the following medications:  alcohol  certain medicines for anxiety or sleep  certain medicines for blood pressure like metoprolol, propranolol  certain medicines for depression or psychotic disturbances  certain medicines for HIV or AIDS like efavirenz, lopinavir, nelfinavir, ritonavir  certain medicines for irregular heart beat like propafenone, flecainide  certain medicines for Parkinson's disease like amantadine, levodopa  certain medicines for seizures like carbamazepine, phenytoin, phenobarbital  cimetidine  clopidogrel  cyclophosphamide  digoxin  furazolidone  isoniazid  nicotine  orphenadrine  procarbazine  steroid medicines like prednisone or cortisone  stimulant medicines for attention disorders, weight loss, or to stay awake  tamoxifen  theophylline  thiotepa  ticlopidine  tramadol  warfarin This list may not describe all possible interactions. Give your health care provider a list of all the medicines, herbs, non-prescription drugs, or dietary supplements you use. Also tell them if you smoke, drink alcohol, or use illegal drugs. Some items may interact with your medicine. What should I watch for while using this medicine? Visit your doctor or healthcare provider for regular checks on your progress. This medicine should be used  together with a patient support program. It is important to participate in a behavioral program, counseling, or other support program that is recommended by your healthcare provider. This medicine may cause serious skin reactions. They can happen weeks to months after starting the medicine. Contact your healthcare provider right away if you notice fevers or flu-like symptoms with a rash. The rash may be red or purple and then turn into blisters or peeling of the skin. Or, you might notice a red rash with swelling of the face, lips or lymph nodes in your neck  or under your arms. Patients and their families should watch out for new or worsening thoughts of suicide or depression. Also watch out for sudden changes in feelings such as feeling anxious, agitated, panicky, irritable, hostile, aggressive, impulsive, severely restless, overly excited and hyperactive, or not being able to sleep. If this happens, especially at the beginning of treatment or after a change in dose, call your healthcare provider. Avoid alcoholic drinks while taking this medicine. Drinking excessive alcoholic beverages, using sleeping or anxiety medicines, or quickly stopping the use of these agents while taking this medicine may increase your risk for a seizure. Do not drive or use heavy machinery until you know how this medicine affects you. This medicine can impair your ability to perform these tasks. Do not take this medicine close to bedtime. It may prevent you from sleeping. Your mouth may get dry. Chewing sugarless gum or sucking hard candy, and drinking plenty of water may help. Contact your doctor if the problem does not go away or is severe. Do not use nicotine patches or chewing gum without the advice of your doctor or healthcare provider while taking this medicine. You may need to have your blood pressure taken regularly if your doctor recommends that you use both nicotine and this medicine together. What side effects may I  notice from receiving this medicine? Side effects that you should report to your doctor or health care professional as soon as possible:  allergic reactions like skin rash, itching or hives, swelling of the face, lips, or tongue  breathing problems  changes in vision  confusion  elevated mood, decreased need for sleep, racing thoughts, impulsive behavior  fast or irregular heartbeat  hallucinations, loss of contact with reality  increased blood pressure  rash, fever, and swollen lymph nodes  redness, blistering, peeling, or loosening of the skin, including inside the mouth  seizures  suicidal thoughts or other mood changes  unusually weak or tired  vomiting Side effects that usually do not require medical attention (report to your doctor or health care professional if they continue or are bothersome):  constipation  headache  loss of appetite  nausea  tremors  weight loss This list may not describe all possible side effects. Call your doctor for medical advice about side effects. You may report side effects to FDA at 1-800-FDA-1088. Where should I keep my medicine? Keep out of the reach of children. Store at room temperature between 20 and 25 degrees C (68 and 77 degrees F). Protect from light. Keep container tightly closed. Throw away any unused medicine after the expiration date. NOTE: This sheet is a summary. It may not cover all possible information. If you have questions about this medicine, talk to your doctor, pharmacist, or health care provider.  2020 Elsevier/Gold Standard (2019-01-14 13:59:09)

## 2020-07-27 NOTE — Progress Notes (Signed)
Established patient visit   Patient: Kristin Cherry   DOB: 04-12-1966   54 y.o. Female  MRN: [id number] Visit Date: 07/27/2020  Today's healthcare provider: Marcille Buffy, FNP   Chief Complaint  Patient presents with  . Diabetes  . Nicotine Dependence    Patient would like to discuss today starting Wellbutrin for smoking cessation    Subjective    HPI HPI    Nicotine Dependence     Additional comments: Patient would like to discuss today starting Wellbutrin for smoking cessation        Last edited by Minette Headland, CMA on 07/27/2020  4:12 PM. (History)      Diabetes Mellitus Type II, Follow-up  Lab Results  Component Value Date   HGBA1C 8.6 (A) 07/27/2020   HGBA1C 9.1 (H) 03/07/2020   Wt Readings from Last 3 Encounters:  07/27/20 239 lb 12.8 oz (108.8 kg)  03/31/20 239 lb (108.4 kg)  03/21/20 239 lb (108.4 kg)   Last seen for diabetes 4 months ago.  Management since then includes none. She reports good compliance with treatment. She is not having side effects. Symptoms: No fatigue No foot ulcerations  No appetite changes No nausea  No paresthesia of the feet  Yes polydipsia  No polyuria No visual disturbances   No vomiting     Home blood sugar records: fasting range: 170-230  Episodes of hypoglycemia? No    Current insulin regiment: none Current exercise: none Current diet habits: in general, an "unhealthy" diet   Patient  denies any fever, body aches,chills, rash, chest pain, shortness of breath, nausea, vomiting, or diarrhea.  Denies dizziness, lightheadedness, pre syncopal or syncopal episodes.    Pertinent Labs: Lab Results  Component Value Date   CHOL 195 03/07/2020   HDL 42 03/07/2020   LDLCALC 124 (H) 03/07/2020   TRIG 164 (H) 03/07/2020   CHOLHDL 4.6 (H) 03/07/2020   Lab Results  Component Value Date   NA 141 03/07/2020   K 4.2 03/07/2020   CREATININE 0.65 03/07/2020   GFRNONAA 101 03/07/2020   GFRAA 116  03/07/2020   GLUCOSE 152 (H) 03/07/2020     ---------------------------------------------------------------------------------------------------  Patient Active Problem List   Diagnosis Date Noted  . Hyperlipidemia associated with type 2 diabetes mellitus ([place]) 04/18/2020  . Pain in limb 03/21/2020  . Stasis dermatitis 03/21/2020  . Screening for blood or protein in urine 03/07/2020  . Hypertension associated with diabetes ([place]) 03/07/2020  . Hair loss 03/07/2020  . Menopause 03/07/2020  . Tobacco abuse 03/07/2020  . Tobacco use disorder, continuous 03/07/2020  . Body mass index (BMI) of 38.0-38.9 in adult 03/07/2020  . Breast mass- right breast 9 oclock  03/07/2020  . Type 2 diabetes mellitus with diabetic polyneuropathy, without long-term current use of insulin ([place]) 03/07/2020  . BMI 40.0-44.9, adult ([place]) 12/21/2018  . Primary osteoarthritis of right knee 12/21/2018  . Rotator cuff tendinitis, left 12/21/2018   Past Medical History:  Diagnosis Date  . Anemia   . Asthma   . Diabetes mellitus without complication ([place])   . History of bronchitis   . Hyperlipidemia   . Hypertension   . Obesity    Social History   Tobacco Use  . Smoking status: Current Every Day Smoker    Packs/day: 0.50    Types: Cigarettes, E-cigarettes  . Smokeless tobacco: Never Used  Vaping Use  . Vaping Use: Never used  Substance Use Topics  . Alcohol use: Not  Currently    Comment: rarely  . Drug use: No   Allergies  Allergen Reactions  . Lodine [Etodolac] Hives  . Penicillins Hives       Medications: Outpatient Medications Prior to Visit  Medication Sig  . dapagliflozin propanediol (FARXIGA) 10 MG TABS tablet Take by mouth.  . rosuvastatin (CRESTOR) 5 MG tablet Take by mouth.  . fluticasone (FLONASE) 50 MCG/ACT nasal spray Place 1 spray into both nostrils daily for 7 days.  . [DISCONTINUED] albuterol (PROVENTIL) (2.5 MG/3ML) 0.083% nebulizer solution Take 2.5 mg by nebulization every  6 (six) hours as needed for wheezing or shortness of breath.  . [DISCONTINUED] gabapentin (NEURONTIN) 100 MG capsule Take 1 capsule (100 mg total) by mouth 2 (two) times daily.  . [DISCONTINUED] gabapentin (NEURONTIN) 300 MG capsule Take by mouth.  . [DISCONTINUED] hydrochlorothiazide (HYDRODIURIL) 25 MG tablet Take 1 tablet (25 mg total) by mouth daily.  . [DISCONTINUED] JANUMET XR 50-1000 MG TB24 Take 2 tablets by mouth daily.  . [DISCONTINUED] lisinopril (ZESTRIL) 20 MG tablet Take 1 tablet (20 mg total) by mouth daily.  . [DISCONTINUED] meloxicam (MOBIC) 15 MG tablet Take 1 tablet (15 mg total) by mouth daily. Take with food.  . [DISCONTINUED] methocarbamol (ROBAXIN) 750 MG tablet Take 1 tablet (750 mg total) by mouth 4 (four) times daily.  . [DISCONTINUED] OZEMPIC, 1 MG/DOSE, 2 MG/1.5ML SOPN INJECT 1 MG DOSE SUBCUTANEOUSLY WEEKLY.  . [DISCONTINUED] OZEMPIC, 1 MG/DOSE, 4 MG/3ML SOPN INJECT 1 MG DOSE SUBCUTANEOUSLY WEEKLY.   No facility-administered medications prior to visit.    Review of Systems  Constitutional: Negative.   HENT: Negative.   Respiratory: Negative.   Cardiovascular: Negative.   Gastrointestinal: Negative.   Genitourinary: Negative.   Musculoskeletal: Positive for arthralgias.  Neurological: Positive for dizziness. Negative for tremors, seizures, syncope, facial asymmetry, speech difficulty, weakness, light-headedness, numbness and headaches.  Psychiatric/Behavioral: Negative.       Objective    BP (!) 144/89   Pulse 84   Temp 98.5 F (36.9 C) (Oral)   Resp 16   Wt 239 lb 12.8 oz (108.8 kg)   LMP 10/02/2018 (Exact Date)   SpO2 98%   BMI 38.70 kg/m    Physical Exam Vitals reviewed.  Constitutional:      General: She is not in acute distress.    Appearance: She is obese. She is not ill-appearing, toxic-appearing or diaphoretic.  HENT:     Head: Normocephalic and atraumatic.     Right Ear: Tympanic membrane, ear canal and external ear normal. There is  no impacted cerumen.     Left Ear: Tympanic membrane, ear canal and external ear normal. There is no impacted cerumen.     Nose: Nose normal. No congestion or rhinorrhea.     Mouth/Throat:     Mouth: Mucous membranes are moist.     Pharynx: Oropharynx is clear. No oropharyngeal exudate or posterior oropharyngeal erythema.  Eyes:     General: No scleral icterus.       Right eye: No discharge.        Left eye: No discharge.     Extraocular Movements: Extraocular movements intact.     Conjunctiva/sclera: Conjunctivae normal.     Pupils: Pupils are equal, round, and reactive to light.  Neck:     Vascular: No carotid bruit.  Cardiovascular:     Rate and Rhythm: Normal rate and regular rhythm.     Pulses: Normal pulses.     Heart sounds: Normal heart sounds.  Pulmonary:     Effort: Pulmonary effort is normal. No respiratory distress.     Breath sounds: Normal breath sounds. No stridor. No wheezing, rhonchi or rales.  Chest:     Chest wall: No tenderness.  Abdominal:     General: There is no distension.     Palpations: Abdomen is soft. There is no mass.     Tenderness: There is no abdominal tenderness. There is no right CVA tenderness, left CVA tenderness, guarding or rebound.     Hernia: No hernia is present.  Musculoskeletal:        General: Normal range of motion.     Cervical back: Normal range of motion and neck supple. No rigidity or tenderness.  Lymphadenopathy:     Cervical: No cervical adenopathy.  Skin:    General: Skin is warm.     Capillary Refill: Capillary refill takes less than 2 seconds.     Coloration: Skin is not jaundiced or pale.     Findings: No bruising, erythema, lesion or rash.  Neurological:     General: No focal deficit present.     Mental Status: She is alert and oriented to person, place, and time.     Cranial Nerves: No cranial nerve deficit.     Sensory: No sensory deficit.     Motor: No weakness.     Coordination: Coordination normal.     Gait:  Gait normal.     Deep Tendon Reflexes: Reflexes normal.  Psychiatric:        Mood and Affect: Mood normal.        Behavior: Behavior normal.        Thought Content: Thought content normal.        Judgment: Judgment normal.      Results for orders placed or performed in visit on 07/27/20  POCT glycosylated hemoglobin (Hb A1C)  Result Value Ref Range   Hemoglobin A1C 8.6 (A) 4.0 - 5.6 %   HbA1c POC (<> result, manual entry)     HbA1c, POC (prediabetic range)     HbA1c, POC (controlled diabetic range)      Assessment & Plan     Controlled type 2 diabetes mellitus with complication, without long-term current use of insulin ([place]) - Plan: POCT glycosylated hemoglobin (Hb A1C), Fe+TIBC+Fer, CBC with Differential/Platelet, Comprehensive Metabolic Panel (CMET)  Hair loss  Elevated ferritin - Plan: Fe+TIBC+Fer, CBC with Differential/Platelet, Comprehensive Metabolic Panel (CMET)  Encounter for smoking cessation counseling  Need for influenza vaccination - Plan: Flu Vaccine QUAD 6+ mos PF IM (Fluarix Quad PF)    Meds ordered this encounter  Medications  . albuterol (VENTOLIN HFA) 108 (90 Base) MCG/ACT inhaler    Sig: Inhale 2 puffs into the lungs every 6 (six) hours as needed for wheezing or shortness of breath.    Dispense:  8 g    Refill:  2  . buPROPion (WELLBUTRIN XL) 300 MG 24 hr tablet    Sig: Take 1 tablet (300 mg total) by mouth daily.    Dispense:  90 tablet    Refill:  1  . gabapentin (NEURONTIN) 100 MG capsule    Sig: Take 1 capsule (100 mg total) by mouth 3 (three) times daily.    Dispense:  180 capsule    Refill:  1    Return in about 3 months (around 10/26/2020) for at any time for any worsening symptoms, Go to Emergency room/ urgent care if worse.     Addressed acute and  or chronic medical problems today requiring 35 minutes reviewing patients medical record,labs, counseling patient regarding patient's conditions, any medications, answering questions regarding  health, and coordination of care as needed. After visit summary patient given copy and reviewed.  Marcille Buffy, [place] [phone] (phone) [phone] (fax)  [place]

## 2020-07-28 NOTE — Progress Notes (Signed)
Continue current plan for diabetes and keep endocrinology referral is advised.

## 2020-08-03 DIAGNOSIS — R7989 Other specified abnormal findings of blood chemistry: Secondary | ICD-10-CM | POA: Diagnosis not present

## 2020-08-03 DIAGNOSIS — E1169 Type 2 diabetes mellitus with other specified complication: Secondary | ICD-10-CM | POA: Diagnosis not present

## 2020-08-03 DIAGNOSIS — I152 Hypertension secondary to endocrine disorders: Secondary | ICD-10-CM | POA: Diagnosis not present

## 2020-08-03 DIAGNOSIS — E1159 Type 2 diabetes mellitus with other circulatory complications: Secondary | ICD-10-CM | POA: Diagnosis not present

## 2020-08-03 DIAGNOSIS — E118 Type 2 diabetes mellitus with unspecified complications: Secondary | ICD-10-CM | POA: Diagnosis not present

## 2020-08-03 DIAGNOSIS — E1142 Type 2 diabetes mellitus with diabetic polyneuropathy: Secondary | ICD-10-CM | POA: Diagnosis not present

## 2020-08-04 LAB — CBC WITH DIFFERENTIAL/PLATELET
Basophils Absolute: 0 10*3/uL (ref 0.0–0.2)
Basos: 0 %
EOS (ABSOLUTE): 0.2 10*3/uL (ref 0.0–0.4)
Eos: 2 %
Hematocrit: 44.2 % (ref 34.0–46.6)
Hemoglobin: 14.5 g/dL (ref 11.1–15.9)
Immature Grans (Abs): 0 10*3/uL (ref 0.0–0.1)
Immature Granulocytes: 0 %
Lymphocytes Absolute: 3.1 10*3/uL (ref 0.7–3.1)
Lymphs: 32 %
MCH: 29.9 pg (ref 26.6–33.0)
MCHC: 32.8 g/dL (ref 31.5–35.7)
MCV: 91 fL (ref 79–97)
Monocytes Absolute: 0.7 10*3/uL (ref 0.1–0.9)
Monocytes: 7 %
Neutrophils Absolute: 5.6 10*3/uL (ref 1.4–7.0)
Neutrophils: 59 %
Platelets: 233 10*3/uL (ref 150–450)
RBC: 4.85 x10E6/uL (ref 3.77–5.28)
RDW: 12.4 % (ref 11.7–15.4)
WBC: 9.7 10*3/uL (ref 3.4–10.8)

## 2020-08-04 LAB — IRON,TIBC AND FERRITIN PANEL
Ferritin: 171 ng/mL — ABNORMAL HIGH (ref 15–150)
Iron Saturation: 31 % (ref 15–55)
Iron: 92 ug/dL (ref 27–159)
Total Iron Binding Capacity: 294 ug/dL (ref 250–450)
UIBC: 202 ug/dL (ref 131–425)

## 2020-08-04 LAB — COMPREHENSIVE METABOLIC PANEL
ALT: 24 IU/L (ref 0–32)
AST: 19 IU/L (ref 0–40)
Albumin/Globulin Ratio: 1.7 (ref 1.2–2.2)
Albumin: 4.2 g/dL (ref 3.8–4.9)
Alkaline Phosphatase: 102 IU/L (ref 44–121)
BUN/Creatinine Ratio: 17 (ref 9–23)
BUN: 11 mg/dL (ref 6–24)
Bilirubin Total: 0.3 mg/dL (ref 0.0–1.2)
CO2: 22 mmol/L (ref 20–29)
Calcium: 9.3 mg/dL (ref 8.7–10.2)
Chloride: 104 mmol/L (ref 96–106)
Creatinine, Ser: 0.66 mg/dL (ref 0.57–1.00)
GFR calc Af Amer: 116 mL/min/{1.73_m2} (ref >59)
GFR calc non Af Amer: 100 mL/min/{1.73_m2} (ref >59)
Globulin, Total: 2.5 g/dL (ref 1.5–4.5)
Glucose: 149 mg/dL — ABNORMAL HIGH (ref 65–99)
Potassium: 4.1 mmol/L (ref 3.5–5.2)
Sodium: 138 mmol/L (ref 134–144)
Total Protein: 6.7 g/dL (ref 6.0–8.5)

## 2020-08-07 NOTE — Progress Notes (Signed)
CBC ok, glucose elevated, kidney and liver function within normal limits. Ferritin slightly lower than previous.

## 2020-09-14 DIAGNOSIS — Z20822 Contact with and (suspected) exposure to covid-19: Secondary | ICD-10-CM | POA: Diagnosis not present

## 2020-09-25 ENCOUNTER — Other Ambulatory Visit: Payer: Self-pay | Admitting: Adult Health

## 2020-09-25 DIAGNOSIS — N6310 Unspecified lump in the right breast, unspecified quadrant: Secondary | ICD-10-CM

## 2020-09-27 ENCOUNTER — Encounter: Payer: Self-pay | Admitting: Adult Health

## 2020-09-27 ENCOUNTER — Telehealth: Payer: Self-pay | Admitting: Adult Health

## 2020-09-27 ENCOUNTER — Other Ambulatory Visit: Payer: Self-pay | Admitting: Adult Health

## 2020-09-27 MED ORDER — OZEMPIC (1 MG/DOSE) 2 MG/1.5ML ~~LOC~~ SOPN: 1.0000 mg | PEN_INJECTOR | SUBCUTANEOUS | 0 refills | Status: DC

## 2020-09-27 NOTE — Telephone Encounter (Signed)
Meds ordered this encounter  Medications   Semaglutide, 1 MG/DOSE, (OZEMPIC, 1 MG/DOSE,) 2 MG/1.5ML SOPN    Sig: Inject 1 mg into the skin once a week.    Dispense:  4.5 mL    Refill:  0    Sent Ozempic as above after reviewing endocrinology note. Messaged patient in Mychart to advise, there was already a Mychart message sent by patient you can mark as completed.

## 2020-09-27 NOTE — Telephone Encounter (Signed)
Not on active medication list, please review. KW

## 2020-09-27 NOTE — Progress Notes (Signed)
Meds ordered this encounter  Medications  . Semaglutide, 1 MG/DOSE, (OZEMPIC, 1 MG/DOSE,) 2 MG/1.5ML SOPN    Sig: Inject 1 mg into the skin once a week.    Dispense:  4.5 mL    Refill:  0

## 2020-09-27 NOTE — Telephone Encounter (Signed)
[facility] faxed refill request for the following medications:    OZEMPIC    Please advise.  Thanks,  [employer]

## 2020-10-02 ENCOUNTER — Other Ambulatory Visit: Payer: Self-pay | Admitting: Adult Health

## 2020-10-02 MED ORDER — OZEMPIC (1 MG/DOSE) 2 MG/1.5ML ~~LOC~~ SOPN: 1.0000 mg | PEN_INJECTOR | SUBCUTANEOUS | 0 refills | Status: DC

## 2020-10-02 NOTE — Progress Notes (Signed)
Meds ordered this encounter  Medications  . Semaglutide, 1 MG/DOSE, (OZEMPIC, 1 MG/DOSE,) 2 MG/1.5ML SOPN    Sig: Inject 1 mg into the skin once a week.    Dispense:  9 mL    Refill:  0

## 2020-10-02 NOTE — Telephone Encounter (Signed)
Requested medication (s) are due for refill today: Yes  Requested medication (s) are on the active medication list: Yes  Last refill:  5 days ago  Future visit scheduled: Yes  Notes to clinic:  Pharmacy clarification needed on Rx, see notes     Requested Prescriptions  Pending Prescriptions Disp Refills   OZEMPIC, 1 MG/DOSE, 4 MG/3ML SOPN [Pharmacy Med Name: OZEMPIC 1 MG/DOSE (4 MG/3 ML)]  0    Sig: INJECT 1 MG INTO THE SKIN ONCE A WEEK.      Endocrinology:  Diabetes - GLP-1 Receptor Agonists Failed - 10/02/2020  5:11 PM      Failed - HBA1C is between 0 and 7.9 and within 180 days    Hemoglobin A1C  Date Value Ref Range Status  07/27/2020 8.6 (A) 4.0 - 5.6 % Final   Hgb A1c MFr Bld  Date Value Ref Range Status  03/07/2020 9.1 (H) 4.8 - 5.6 % Final    Comment:             Prediabetes: 5.7 - 6.4          Diabetes: >6.4          Glycemic control for adults with diabetes: <7.0           Passed - Valid encounter within last 6 months    Recent Outpatient Visits           2 months ago Controlled type 2 diabetes mellitus with complication, without long-term current use of insulin ([place])   [place], Kelby Aline, FNP   6 months ago Osteoporosis screening   [place], Kelby Aline, FNP       Future Appointments             In 3 months [place], Kelby Aline, [place], [place]

## 2020-10-06 ENCOUNTER — Other Ambulatory Visit: Payer: BC Managed Care – PPO

## 2020-10-15 ENCOUNTER — Other Ambulatory Visit: Payer: Self-pay | Admitting: Adult Health

## 2020-10-25 ENCOUNTER — Other Ambulatory Visit: Payer: Self-pay

## 2020-10-25 ENCOUNTER — Ambulatory Visit
Admission: RE | Admit: 2020-10-25 | Discharge: 2020-10-25 | Disposition: A | Discharge status: Home / Self Care | Payer: BC Managed Care – PPO | Source: Ambulatory Visit | Attending: Adult Health | Admitting: Adult Health

## 2020-10-25 DIAGNOSIS — N6310 Unspecified lump in the right breast, unspecified quadrant: Secondary | ICD-10-CM | POA: Insufficient documentation

## 2020-10-25 DIAGNOSIS — N6313 Unspecified lump in the right breast, lower outer quadrant: Secondary | ICD-10-CM | POA: Diagnosis not present

## 2020-10-25 DIAGNOSIS — N6311 Unspecified lump in the right breast, upper outer quadrant: Secondary | ICD-10-CM | POA: Diagnosis not present

## 2020-10-26 ENCOUNTER — Other Ambulatory Visit: Payer: Self-pay | Admitting: Adult Health

## 2020-10-26 DIAGNOSIS — R928 Other abnormal and inconclusive findings on diagnostic imaging of breast: Secondary | ICD-10-CM

## 2020-10-26 DIAGNOSIS — N6310 Unspecified lump in the right breast, unspecified quadrant: Secondary | ICD-10-CM

## 2020-11-01 ENCOUNTER — Ambulatory Visit
Admission: RE | Admit: 2020-11-01 | Discharge: 2020-11-01 | Disposition: A | Discharge status: Home / Self Care | Payer: BC Managed Care – PPO | Source: Ambulatory Visit | Attending: Adult Health | Admitting: Adult Health

## 2020-11-01 ENCOUNTER — Other Ambulatory Visit: Payer: Self-pay

## 2020-11-01 DIAGNOSIS — N6310 Unspecified lump in the right breast, unspecified quadrant: Secondary | ICD-10-CM | POA: Insufficient documentation

## 2020-11-01 DIAGNOSIS — R928 Other abnormal and inconclusive findings on diagnostic imaging of breast: Secondary | ICD-10-CM | POA: Diagnosis not present

## 2020-11-01 DIAGNOSIS — N6315 Unspecified lump in the right breast, overlapping quadrants: Secondary | ICD-10-CM | POA: Diagnosis not present

## 2020-11-01 HISTORY — PX: BREAST BIOPSY: SHX20

## 2020-11-02 LAB — SURGICAL PATHOLOGY

## 2020-11-17 DIAGNOSIS — M25561 Pain in right knee: Secondary | ICD-10-CM | POA: Diagnosis not present

## 2020-11-17 DIAGNOSIS — S86912A Strain of unspecified muscle(s) and tendon(s) at lower leg level, left leg, initial encounter: Secondary | ICD-10-CM | POA: Insufficient documentation

## 2020-11-17 DIAGNOSIS — M1711 Unilateral primary osteoarthritis, right knee: Secondary | ICD-10-CM | POA: Diagnosis not present

## 2020-11-17 DIAGNOSIS — M25562 Pain in left knee: Secondary | ICD-10-CM | POA: Diagnosis not present

## 2020-12-18 DIAGNOSIS — M67873 Other specified disorders of tendon, right ankle and foot: Secondary | ICD-10-CM | POA: Diagnosis not present

## 2020-12-27 ENCOUNTER — Other Ambulatory Visit: Payer: Self-pay | Admitting: Adult Health

## 2020-12-27 NOTE — Telephone Encounter (Signed)
Message from pharmacy saying Ozempic is on back order.  Also she is seeing and following up with Endocrinology so this request should really be going to endocrinologist since he is managing diabetes for her.

## 2020-12-28 ENCOUNTER — Encounter: Payer: Self-pay | Admitting: Adult Health

## 2020-12-29 ENCOUNTER — Other Ambulatory Visit: Payer: Self-pay | Admitting: Adult Health

## 2020-12-29 MED ORDER — OZEMPIC (1 MG/DOSE) 4 MG/3ML ~~LOC~~ SOPN: 1.0000 mg | PEN_INJECTOR | SUBCUTANEOUS | 1 refills | Status: DC

## 2020-12-29 NOTE — Progress Notes (Signed)
Meds ordered this encounter  Medications  . Semaglutide, 1 MG/DOSE, (OZEMPIC, 1 MG/DOSE,) 4 MG/3ML SOPN    Sig: Inject 1 mg into the skin once a week.    Dispense:  9 mL    Refill:  1  [facility] [address]

## 2020-12-29 NOTE — Progress Notes (Signed)
Meds ordered this encounter  Medications  . Semaglutide, 1 MG/DOSE, (OZEMPIC, 1 MG/DOSE,) 4 MG/3ML SOPN    Sig: Inject 1 mg into the skin once a week.    Dispense:  9 mL    Refill:  1  [facility]

## 2021-01-24 ENCOUNTER — Ambulatory Visit (INDEPENDENT_AMBULATORY_CARE_PROVIDER_SITE_OTHER): Payer: BC Managed Care – PPO | Admitting: Adult Health

## 2021-01-24 ENCOUNTER — Encounter: Payer: Self-pay | Admitting: *Deleted

## 2021-01-24 ENCOUNTER — Other Ambulatory Visit: Payer: Self-pay | Admitting: Adult Health

## 2021-01-24 ENCOUNTER — Encounter: Payer: Self-pay | Admitting: Adult Health

## 2021-01-24 ENCOUNTER — Other Ambulatory Visit: Payer: Self-pay

## 2021-01-24 VITALS — BP 140/90 | HR 71 | Resp 16 | Wt 234.8 lb

## 2021-01-24 DIAGNOSIS — Z1231 Encounter for screening mammogram for malignant neoplasm of breast: Secondary | ICD-10-CM

## 2021-01-24 DIAGNOSIS — Z6841 Body Mass Index (BMI) 40.0 and over, adult: Secondary | ICD-10-CM | POA: Diagnosis not present

## 2021-01-24 DIAGNOSIS — K219 Gastro-esophageal reflux disease without esophagitis: Secondary | ICD-10-CM | POA: Diagnosis not present

## 2021-01-24 DIAGNOSIS — E118 Type 2 diabetes mellitus with unspecified complications: Secondary | ICD-10-CM | POA: Diagnosis not present

## 2021-01-24 LAB — POCT GLYCOSYLATED HEMOGLOBIN (HGB A1C): Hemoglobin A1C: 8.6 % — AB (ref 4.0–5.6)

## 2021-01-24 MED ORDER — OMEPRAZOLE 40 MG PO CPDR: 40.0000 mg | DELAYED_RELEASE_CAPSULE | Freq: Every day | ORAL | 2 refills | Status: DC

## 2021-01-24 NOTE — Progress Notes (Signed)
Established patient visit   Patient: Kristin Cherry   DOB: [birth year]   55 y.o. Female  MRN: [id number] Visit Date: 01/24/2021  Today's healthcare provider: Marcille Buffy, FNP   Chief Complaint  Patient presents with  . Diabetes  . Gastroesophageal Reflux   Subjective    Gastroesophageal Reflux She complains of belching, early satiety, globus sensation, heartburn and water brash. She reports no abdominal pain, no chest pain, no choking, no coughing, no dysphagia, no hoarse voice, no nausea, no sore throat, no stridor, no tooth decay or no wheezing. This is a recurrent problem. The current episode started more than 1 month ago. The problem occurs frequently. The problem has been unchanged. The heartburn is of moderate intensity. The heartburn does not wake her from sleep. The heartburn does not limit her activity. The heartburn doesn't change with position. The symptoms are aggravated by certain foods.    Tums and or alka seltzer and relieves. Not on a PPI.  Due for colonoscopy. Has had endoscopy years ago wants to consider another.  Denies any bleeding.  Has had internal and external hemorrhoids for years she wants to consider having external hemorrhoid banded but would like to see gastroenterology first for exams.    Declined in office exam today.  Diabetes Mellitus Type II, Follow-up  Lab Results  Component Value Date   HGBA1C 8.6 (A) 01/24/2021   HGBA1C 8.6 (A) 07/27/2020   HGBA1C 9.1 (H) 03/07/2020   Wt Readings from Last 3 Encounters:  01/24/21 234 lb 12.8 oz (106.5 kg)  07/27/20 239 lb 12.8 oz (108.8 kg)  03/31/20 239 lb (108.4 kg)  Still on Ozempic 1 mg q week and Farxiga 10 mg daily as well as Amaryl 2 mg daily.  Last seen for diabetes 6 months ago.  Management since then includes none labs were ordered. She reports excellent compliance with treatment. She is not having side effects.  Symptoms: No fatigue No foot ulcerations  No appetite changes No  nausea  Yes paresthesia of the feet  No polydipsia  No polyuria No visual disturbances   No vomiting     She has to see Dr. Honor Junes in endocrinology next week.   Home blood sugar records: 120-122  Episodes of hypoglycemia? No    Current insulin regiment: ozempic 1mg  nce a week Most Recent Eye Exam: >1 year Current exercise: none Current diet habits: not asked  Pertinent Labs: Lab Results  Component Value Date   CHOL 195 03/07/2020   HDL 42 03/07/2020   LDLCALC 124 (H) 03/07/2020   TRIG 164 (H) 03/07/2020   CHOLHDL 4.6 (H) 03/07/2020   Lab Results  Component Value Date   NA 138 08/03/2020   K 4.1 08/03/2020   CREATININE 0.66 08/03/2020   GFRNONAA 100 08/03/2020   GFRAA 116 08/03/2020   GLUCOSE 149 (H) 08/03/2020     ---------------------------------------------------------------------------------------------------  Patient Active Problem List   Diagnosis Date Noted  . Hyperlipidemia associated with type 2 diabetes mellitus ([place]) 04/18/2020  . Pain in limb 03/21/2020  . Stasis dermatitis 03/21/2020  . Screening for blood or protein in urine 03/07/2020  . Hypertension associated with diabetes ([place]) 03/07/2020  . Hair loss 03/07/2020  . Menopause 03/07/2020  . Tobacco abuse 03/07/2020  . Tobacco use disorder, continuous 03/07/2020  . Body mass index (BMI) of 38.0-38.9 in adult 03/07/2020  . Breast mass- right breast 9 oclock  03/07/2020  . Type 2 diabetes mellitus with diabetic polyneuropathy, without  long-term current use of insulin ([place]) 03/07/2020  . BMI 40.0-44.9, adult ([place]) 12/21/2018  . Primary osteoarthritis of right knee 12/21/2018  . Rotator cuff tendinitis, left 12/21/2018   Past Medical History:  Diagnosis Date  . Anemia   . Asthma   . Diabetes mellitus without complication ([employer])   . History of bronchitis   . Hyperlipidemia   . Hypertension   . Obesity    Allergies  Allergen Reactions  . Lodine [Etodolac] Hives  . Penicillins Hives        Medications: Outpatient Medications Prior to Visit  Medication Sig  . albuterol (VENTOLIN HFA) 108 (90 Base) MCG/ACT inhaler Inhale 2 puffs into the lungs every 6 (six) hours as needed for wheezing or shortness of breath.  Marland Kitchen buPROPion (WELLBUTRIN XL) 300 MG 24 hr tablet TAKE 1 TABLET BY MOUTH EVERY DAY  . dapagliflozin propanediol (FARXIGA) 10 MG TABS tablet Take by mouth.  . gabapentin (NEURONTIN) 100 MG capsule Take 1 capsule (100 mg total) by mouth 3 (three) times daily.  Marland Kitchen glimepiride (AMARYL) 2 MG tablet Take by mouth.  . rosuvastatin (CRESTOR) 5 MG tablet Take by mouth.  . Semaglutide, 1 MG/DOSE, (OZEMPIC, 1 MG/DOSE,) 4 MG/3ML SOPN Inject 1 mg into the skin once a week.  . fluticasone (FLONASE) 50 MCG/ACT nasal spray Place 1 spray into both nostrils daily for 7 days.   No facility-administered medications prior to visit.    Review of Systems  HENT: Negative for hoarse voice and sore throat.   Respiratory: Negative for cough, choking and wheezing.   Cardiovascular: Negative for chest pain.  Gastrointestinal: Positive for heartburn. Negative for abdominal pain, dysphagia and nausea.    Last CBC Lab Results  Component Value Date   WBC 9.7 08/03/2020   HGB 14.5 08/03/2020   HCT 44.2 08/03/2020   MCV 91 08/03/2020   MCH 29.9 08/03/2020   RDW 12.4 08/03/2020   PLT 233 98/33/8250   Last metabolic panel Lab Results  Component Value Date   GLUCOSE 149 (H) 08/03/2020   NA 138 08/03/2020   K 4.1 08/03/2020   CL 104 08/03/2020   CO2 22 08/03/2020   BUN 11 08/03/2020   CREATININE 0.66 08/03/2020   GFRNONAA 100 08/03/2020   GFRAA 116 08/03/2020   CALCIUM 9.3 08/03/2020   PROT 6.7 08/03/2020   ALBUMIN 4.2 08/03/2020   LABGLOB 2.5 08/03/2020   AGRATIO 1.7 08/03/2020   BILITOT 0.3 08/03/2020   ALKPHOS 102 08/03/2020   AST 19 08/03/2020   ALT 24 08/03/2020   ANIONGAP 11 05/03/2019   Last lipids Lab Results  Component Value Date   CHOL 195 03/07/2020   HDL 42  03/07/2020   LDLCALC 124 (H) 03/07/2020   TRIG 164 (H) 03/07/2020   CHOLHDL 4.6 (H) 03/07/2020   Last hemoglobin A1c Lab Results  Component Value Date   HGBA1C 8.6 (A) 01/24/2021   Last thyroid functions Lab Results  Component Value Date   TSH 0.790 03/07/2020   Last vitamin D No results found for: 25OHVITD2, 25OHVITD3, VD25OH Last vitamin B12 and Folate Lab Results  Component Value Date   VITAMINB12 251 03/07/2020       Objective    BP 140/90   Pulse 71   Resp 16   Wt 234 lb 12.8 oz (106.5 kg)   LMP 10/02/2018 (Exact Date)   SpO2 96%   BMI 37.90 kg/m  BP Readings from Last 3 Encounters:  01/24/21 140/90  07/27/20 (!) 144/89  03/31/20 Marland Kitchen)  143/85   Wt Readings from Last 3 Encounters:  01/24/21 234 lb 12.8 oz (106.5 kg)  07/27/20 239 lb 12.8 oz (108.8 kg)  03/31/20 239 lb (108.4 kg)       Physical Exam Vitals reviewed.  Constitutional:      General: She is not in acute distress.    Appearance: She is well-developed. She is not diaphoretic.     Interventions: She is not intubated. HENT:     Head: Normocephalic and atraumatic.     Right Ear: External ear normal.     Left Ear: External ear normal.     Nose: Nose normal.     Mouth/Throat:     Pharynx: No oropharyngeal exudate.  Eyes:     General: Lids are normal. No scleral icterus.       Right eye: No discharge.        Left eye: No discharge.     Conjunctiva/sclera: Conjunctivae normal.     Right eye: Right conjunctiva is not injected. No exudate or hemorrhage.    Left eye: Left conjunctiva is not injected. No exudate or hemorrhage.    Pupils: Pupils are equal, round, and reactive to light.  Neck:     Thyroid: No thyroid mass or thyromegaly.     Vascular: Normal carotid pulses. No carotid bruit, hepatojugular reflux or JVD.     Trachea: Trachea and phonation normal. No tracheal tenderness or tracheal deviation.     Meningeal: Brudzinski's sign and Kernig's sign absent.  Cardiovascular:     Rate and  Rhythm: Normal rate and regular rhythm.     Pulses: Normal pulses.          Radial pulses are 2+ on the right side and 2+ on the left side.       Dorsalis pedis pulses are 2+ on the right side and 2+ on the left side.       Posterior tibial pulses are 2+ on the right side and 2+ on the left side.     Heart sounds: Normal heart sounds, S1 normal and S2 normal. Heart sounds not distant. No murmur heard. No friction rub. No gallop.   Pulmonary:     Effort: Pulmonary effort is normal. No tachypnea, bradypnea, accessory muscle usage or respiratory distress. She is not intubated.     Breath sounds: Normal breath sounds. No stridor. No wheezing or rales.  Chest:     Chest wall: No tenderness.  Breasts:     Right: No supraclavicular adenopathy.     Left: No supraclavicular adenopathy.    Abdominal:     General: Bowel sounds are normal. There is no distension or abdominal bruit.     Palpations: Abdomen is soft. There is no shifting dullness, fluid wave, hepatomegaly, splenomegaly, mass or pulsatile mass.     Tenderness: There is no abdominal tenderness. There is no right CVA tenderness, left CVA tenderness, guarding or rebound.     Hernia: No hernia is present.  Musculoskeletal:        General: No tenderness or deformity. Normal range of motion.     Cervical back: Full passive range of motion without pain, normal range of motion and neck supple. No edema, erythema, rigidity or tenderness. No spinous process tenderness or muscular tenderness. Normal range of motion.  Lymphadenopathy:     Head:     Right side of head: No submental, submandibular, tonsillar, preauricular, posterior auricular or occipital adenopathy.     Left side of head: No submental,  submandibular, tonsillar, preauricular, posterior auricular or occipital adenopathy.     Cervical: No cervical adenopathy.     Right cervical: No superficial, deep or posterior cervical adenopathy.    Left cervical: No superficial, deep or posterior  cervical adenopathy.     Upper Body:     Right upper body: No supraclavicular or pectoral adenopathy.     Left upper body: No supraclavicular or pectoral adenopathy.  Skin:    General: Skin is warm and dry.     Coloration: Skin is not pale.     Findings: No abrasion, bruising, burn, ecchymosis, erythema, lesion, petechiae or rash.     Nails: There is no clubbing.  Neurological:     Mental Status: She is alert and oriented to person, place, and time.     GCS: GCS eye subscore is 4. GCS verbal subscore is 5. GCS motor subscore is 6.     Cranial Nerves: No cranial nerve deficit.     Sensory: No sensory deficit.     Motor: No tremor, atrophy, abnormal muscle tone or seizure activity.     Coordination: Coordination normal.     Gait: Gait normal.     Deep Tendon Reflexes: Reflexes are normal and symmetric. Reflexes normal. Babinski sign absent on the right side. Babinski sign absent on the left side.     Reflex Scores:      Tricep reflexes are 2+ on the right side and 2+ on the left side.      Bicep reflexes are 2+ on the right side and 2+ on the left side.      Brachioradialis reflexes are 2+ on the right side and 2+ on the left side.      Patellar reflexes are 2+ on the right side and 2+ on the left side.      Achilles reflexes are 2+ on the right side and 2+ on the left side. Psychiatric:        Speech: Speech normal.        Behavior: Behavior normal.        Thought Content: Thought content normal.        Judgment: Judgment normal.      Results for orders placed or performed in visit on 01/24/21  POCT glycosylated hemoglobin (Hb A1C)  Result Value Ref Range   Hemoglobin A1C 8.6 (A) 4.0 - 5.6 %   HbA1c POC (<> result, manual entry)     HbA1c, POC (prediabetic range)     HbA1c, POC (controlled diabetic range)      Assessment & Plan     Controlled type 2 diabetes mellitus with complication, without long-term current use of insulin ([place]) - Plan: POCT glycosylated hemoglobin (Hb  A1C), Ambulatory referral to Gastroenterology, CBC with Differential/Platelet, Comprehensive Metabolic Panel (CMET), TSH, Lipid Panel w/o Chol/HDL Ratio, HgB A1c  Gastroesophageal reflux disease, unspecified whether esophagitis present - Plan: Ambulatory referral to Gastroenterology, CBC with Differential/Platelet, Comprehensive Metabolic Panel (CMET), TSH, Lipid Panel w/o Chol/HDL Ratio, HgB A1c  Screening mammogram for breast cancer - Plan: MM Digital Screening  Keep follow up With Endocrinology on Thursday need medication adjustments, diet and exercises  advised as well.   Meds ordered this encounter  Medications  . omeprazole (PRILOSEC) 40 MG capsule    Sig: Take 1 capsule (40 mg total) by mouth daily.    Dispense:  30 capsule    Refill:  2    Red Flags discussed. The patient was given clear instructions to go to  ER or return to medical center if any red flags develop, symptoms do not improve, worsen or new problems develop. They verbalized understanding.  Should hear from gastroenterology within 2 weeks.   return for physical and PAP when due.she will schedule.  Return in about 3 months (around 04/26/2021), or if symptoms worsen or fail to improve, for at any time for any worsening symptoms, Go to Emergency room/ urgent care if worse.      The entirety of the information documented in the History of Present Illness, Review of Systems and Physical Exam were personally obtained by me. Portions of this information were initially documented by the CMA and reviewed by me for thoroughness and accuracy.      Marcille Buffy, [place] [phone] (phone) [phone] (fax)  [place]

## 2021-01-24 NOTE — Addendum Note (Signed)
Addended by: Doreen Beam on: 01/24/2021 01:20 PM   Modules accepted: Orders

## 2021-01-24 NOTE — Telephone Encounter (Signed)
Requested Prescriptions  Pending Prescriptions Disp Refills  . buPROPion (WELLBUTRIN XL) 300 MG 24 hr tablet [Pharmacy Med Name: BUPROPION HCL XL 300 MG TABLET] 90 tablet 0    Sig: TAKE 1 TABLET BY MOUTH EVERY DAY     Psychiatry: Antidepressants - bupropion Failed - 01/24/2021  1:23 AM      Failed - Last BP in normal range    BP Readings from Last 1 Encounters:  07/27/20 (!) 144/89         Failed - Valid encounter within last 6 months    Recent Outpatient Visits          6 months ago Controlled type 2 diabetes mellitus with complication, without long-term current use of insulin ([place])   [facility] Flinchum, Kelby Aline, FNP   10 months ago Osteoporosis screening   [facility] Flinchum, Kelby Aline, FNP      Future Appointments            Today Flinchum, Kelby Aline, [place], [place]

## 2021-01-24 NOTE — Patient Instructions (Addendum)
Call to schedule your screening mammogram. Your orders have been placed for your exam.  Let our office know if you have questions, concerns, or any difficulty scheduling.  If normal results then yearly screening mammograms are recommended unless you notice  Changes in your breast then you should schedule a follow up office visit. If abnormal results  Further imaging will be warranted and sooner follow up as determined by the radiologist at the [facility].   [facility] at [place], [place] [address]  Main: [phone]  Diabetes Mellitus and Nutrition, Adult When you have diabetes, or diabetes mellitus, it is very important to have healthy eating habits because your blood sugar (glucose) levels are greatly affected by what you eat and drink. Eating healthy foods in the right amounts, at about the same times every day, can help you:  Control your blood glucose.  Lower your risk of heart disease.  Improve your blood pressure.  Reach or maintain a healthy weight. What can affect my meal plan? Every person with diabetes is different, and each person has different needs for a meal plan. Your health care provider may recommend that you work with a dietitian to make a meal plan that is best for you. Your meal plan may vary depending on factors such as:  The calories you need.  The medicines you take.  Your weight.  Your blood glucose, blood pressure, and cholesterol levels.  Your activity level.  Other health conditions you have, such as heart or kidney disease. How do carbohydrates affect me? Carbohydrates, also called carbs, affect your blood glucose level more than any other type of food. Eating carbs naturally raises the amount of glucose in your blood. Carb counting is a method for keeping track of how many carbs you eat. Counting carbs is important to keep your blood glucose at a healthy level, especially if you use insulin or  take certain oral diabetes medicines. It is important to know how many carbs you can safely have in each meal. This is different for every person. Your dietitian can help you calculate how many carbs you should have at each meal and for each snack. How does alcohol affect me? Alcohol can cause a sudden decrease in blood glucose (hypoglycemia), especially if you use insulin or take certain oral diabetes medicines. Hypoglycemia can be a life-threatening condition. Symptoms of hypoglycemia, such as sleepiness, dizziness, and confusion, are similar to symptoms of having too much alcohol.  Do not drink alcohol if: ? Your health care provider tells you not to drink. ? You are pregnant, may be pregnant, or are planning to become pregnant.  If you drink alcohol: ? Do not drink on an empty stomach. ? Limit how much you use to:  0-1 drink a day for women.  0-2 drinks a day for men. ? Be aware of how much alcohol is in your drink. In the [place] one drink equals one 12 oz bottle of beer (355 mL), one 5 oz glass of wine (148 mL), or one 1 oz glass of hard liquor (44 mL). ? Keep yourself hydrated with water, diet soda, or unsweetened iced tea.  Keep in mind that regular soda, juice, and other mixers may contain a lot of sugar and must be counted as carbs. What are tips for following this plan? Reading food labels  Start by checking the serving size on the "Nutrition Facts" label of packaged foods and drinks. The amount of  calories, carbs, fats, and other nutrients listed on the label is based on one serving of the item. Many items contain more than one serving per package.  Check the total grams (g) of carbs in one serving. You can calculate the number of servings of carbs in one serving by dividing the total carbs by 15. For example, if a food has 30 g of total carbs per serving, it would be equal to 2 servings of carbs.  Check the number of grams (g) of saturated fats and trans fats in one serving.  Choose foods that have a low amount or none of these fats.  Check the number of milligrams (mg) of salt (sodium) in one serving. Most people should limit total sodium intake to less than 2,300 mg per day.  Always check the nutrition information of foods labeled as "low-fat" or "nonfat." These foods may be higher in added sugar or refined carbs and should be avoided.  Talk to your dietitian to identify your daily goals for nutrients listed on the label. Shopping  Avoid buying canned, pre-made, or processed foods. These foods tend to be high in fat, sodium, and added sugar.  Shop around the outside edge of the grocery store. This is where you will most often find fresh fruits and vegetables, bulk grains, fresh meats, and fresh dairy. Cooking  Use low-heat cooking methods, such as baking, instead of high-heat cooking methods like deep frying.  Cook using healthy oils, such as olive, canola, or sunflower oil.  Avoid cooking with butter, cream, or high-fat meats. Meal planning  Eat meals and snacks regularly, preferably at the same times every day. Avoid going long periods of time without eating.  Eat foods that are high in fiber, such as fresh fruits, vegetables, beans, and whole grains. Talk with your dietitian about how many servings of carbs you can eat at each meal.  Eat 4-6 oz (112-168 g) of lean protein each day, such as lean meat, chicken, fish, eggs, or tofu. One ounce (oz) of lean protein is equal to: ? 1 oz (28 g) of meat, chicken, or fish. ? 1 egg. ?  cup (62 g) of tofu.  Eat some foods each day that contain healthy fats, such as avocado, nuts, seeds, and fish.   What foods should I eat? Fruits Berries. Apples. Oranges. Peaches. Apricots. Plums. Grapes. Mango. Papaya. Pomegranate. Kiwi. Cherries. Vegetables Lettuce. Spinach. Leafy greens, including kale, chard, collard greens, and mustard greens. Beets. Cauliflower. Cabbage. Broccoli. Carrots. Green beans. Tomatoes.  Peppers. Onions. Cucumbers. Brussels sprouts. Grains Whole grains, such as whole-wheat or whole-grain bread, crackers, tortillas, cereal, and pasta. Unsweetened oatmeal. Quinoa. Brown or wild rice. Meats and other proteins Seafood. Poultry without skin. Lean cuts of poultry and beef. Tofu. Nuts. Seeds. Dairy Low-fat or fat-free dairy products such as milk, yogurt, and cheese. The items listed above may not be a complete list of foods and beverages you can eat. Contact a dietitian for more information. What foods should I avoid? Fruits Fruits canned with syrup. Vegetables Canned vegetables. Frozen vegetables with butter or cream sauce. Grains Refined white flour and flour products such as bread, pasta, snack foods, and cereals. Avoid all processed foods. Meats and other proteins Fatty cuts of meat. Poultry with skin. Breaded or fried meats. Processed meat. Avoid saturated fats. Dairy Full-fat yogurt, cheese, or milk. Beverages Sweetened drinks, such as soda or iced tea. The items listed above may not be a complete list of foods and beverages you should avoid.  Contact a dietitian for more information. Questions to ask a health care provider  Do I need to meet with a diabetes educator?  Do I need to meet with a dietitian?  What number can I call if I have questions?  When are the best times to check my blood glucose? Where to find more information:  American Diabetes Association: diabetes.org  Academy of Nutrition and Dietetics: www.eatright.[employer] of Diabetes and Digestive and Kidney Diseases: [employer]  Association of Diabetes Care and Education Specialists: www.diabeteseducator.org Summary  It is important to have healthy eating habits because your blood sugar (glucose) levels are greatly affected by what you eat and drink.  A healthy meal plan will help you control your blood glucose and maintain a healthy lifestyle.  Your health care provider  may recommend that you work with a dietitian to make a meal plan that is best for you.  Keep in mind that carbohydrates (carbs) and alcohol have immediate effects on your blood glucose levels. It is important to count carbs and to use alcohol carefully. This information is not intended to replace advice given to you by your health care provider. Make sure you discuss any questions you have with your health care provider. Document Revised: 09/28/2019 Document Reviewed: 09/28/2019 Elsevier Patient Education  2021 [place]. Psyllium granules or powder for solution What is this medicine? PSYLLIUM (SIL i yum) is a bulk-forming fiber laxative. This medicine is used to treat constipation. Increasing fiber in the diet may also help lower cholesterol and promote heart health for some people. This medicine may be used for other purposes; ask your health care provider or pharmacist if you have questions. COMMON BRAND NAME(S): Fiber Therapy, GenFiber, Geri-Mucil, Hydrocil, Konsyl, Metamucil, Metamucil MultiHealth, Mucilin, Natural Fiber Therapy, Reguloid What should I tell my health care provider before I take this medicine? They need to know if you have any of these conditions:  blockage in your bowel  difficulty swallowing  inflammatory bowel disease  phenylketonuria  stomach or intestine problems  sudden change in bowel habits lasting more than 2 weeks  an unusual or allergic reaction to psyllium, other medicines, dyes, or preservatives  pregnant or trying or get pregnant  breast-feeding How should I use this medicine? Mix this medicine into a full glass (240 mL) of water or other cool drink. Take this medicine by mouth. Follow the directions on the package labeling, or take as directed by your health care professional. Take your medicine at regular intervals. Do not take your medicine more often than directed. Talk to your pediatrician regarding the use of this medicine in children.  While this drug may be prescribed for children as young as 61 years old for selected conditions, precautions do apply. Overdosage: If you think you have taken too much of this medicine contact a poison control center or emergency room at once. NOTE: This medicine is only for you. Do not share this medicine with others. What if I miss a dose? If you miss a dose, take it as soon as you can. If it is almost time for your next dose, take only that dose. Do not take double or extra doses. What may interact with this medicine? Interactions are not expected. Take this product at least 2 hours before or after other medicines. This list may not describe all possible interactions. Give your health care provider a list of all the medicines, herbs, non-prescription drugs, or dietary supplements you use. Also tell them if you smoke,  drink alcohol, or use illegal drugs. Some items may interact with your medicine. What should I watch for while using this medicine? Check with your doctor or health care professional if your symptoms do not start to get better or if they get worse. Stop using this medicine and contact your doctor or health care professional if you have rectal bleeding or if you have to treat your constipation for more than 1 week. These could be signs of a more serious condition. Drink several glasses of water a day while you are taking this medicine. This will help to relieve constipation and prevent dehydration. What side effects may I notice from receiving this medicine? Side effects that you should report to your doctor or health care professional as soon as possible:  allergic reactions like skin rash, itching or hives, swelling of the face, lips, or tongue  breathing problems  chest pain  nausea, vomiting  rectal bleeding  trouble swallowing Side effects that usually do not require medical attention (report to your doctor or health care professional if they continue or are  bothersome):  bloating  gas  stomach cramps This list may not describe all possible side effects. Call your doctor for medical advice about side effects. You may report side effects to FDA at 1-800-FDA-1088. Where should I keep my medicine? Keep out of the reach of children. Store at room temperature between 15 and 30 degrees C (59 and 86 degrees F). Protect from moisture. Throw away any unused medicine after the expiration date. NOTE: This sheet is a summary. It may not cover all possible information. If you have questions about this medicine, talk to your doctor, pharmacist, or health care provider.  2021 Elsevier/Gold Standard (2018-03-17 15:41:08)

## 2021-01-25 LAB — CBC WITH DIFFERENTIAL/PLATELET
Basophils Absolute: 0 10*3/uL (ref 0.0–0.2)
Basos: 0 %
EOS (ABSOLUTE): 0.2 10*3/uL (ref 0.0–0.4)
Eos: 2 %
Hematocrit: 47 % — ABNORMAL HIGH (ref 34.0–46.6)
Hemoglobin: 15.6 g/dL (ref 11.1–15.9)
Immature Grans (Abs): 0 10*3/uL (ref 0.0–0.1)
Immature Granulocytes: 0 %
Lymphocytes Absolute: 3.2 10*3/uL — ABNORMAL HIGH (ref 0.7–3.1)
Lymphs: 29 %
MCH: 30.2 pg (ref 26.6–33.0)
MCHC: 33.2 g/dL (ref 31.5–35.7)
MCV: 91 fL (ref 79–97)
Monocytes Absolute: 0.8 10*3/uL (ref 0.1–0.9)
Monocytes: 7 %
Neutrophils Absolute: 6.8 10*3/uL (ref 1.4–7.0)
Neutrophils: 62 %
Platelets: 278 10*3/uL (ref 150–450)
RBC: 5.17 x10E6/uL (ref 3.77–5.28)
RDW: 12.9 % (ref 11.7–15.4)
WBC: 11.1 10*3/uL — ABNORMAL HIGH (ref 3.4–10.8)

## 2021-01-25 LAB — LIPID PANEL W/O CHOL/HDL RATIO
Cholesterol, Total: 153 mg/dL (ref 100–199)
HDL: 37 mg/dL — ABNORMAL LOW (ref >39)
LDL Chol Calc (NIH): 89 mg/dL (ref 0–99)
Triglycerides: 157 mg/dL — ABNORMAL HIGH (ref 0–149)
VLDL Cholesterol Cal: 27 mg/dL (ref 5–40)

## 2021-01-25 LAB — COMPREHENSIVE METABOLIC PANEL
ALT: 21 IU/L (ref 0–32)
AST: 14 IU/L (ref 0–40)
Albumin/Globulin Ratio: 2 (ref 1.2–2.2)
Albumin: 4.5 g/dL (ref 3.8–4.9)
Alkaline Phosphatase: 107 IU/L (ref 44–121)
BUN/Creatinine Ratio: 17 (ref 9–23)
BUN: 13 mg/dL (ref 6–24)
Bilirubin Total: 0.3 mg/dL (ref 0.0–1.2)
CO2: 22 mmol/L (ref 20–29)
Calcium: 9.5 mg/dL (ref 8.7–10.2)
Chloride: 100 mmol/L (ref 96–106)
Creatinine, Ser: 0.78 mg/dL (ref 0.57–1.00)
Globulin, Total: 2.3 g/dL (ref 1.5–4.5)
Glucose: 146 mg/dL — ABNORMAL HIGH (ref 65–99)
Potassium: 4.2 mmol/L (ref 3.5–5.2)
Sodium: 141 mmol/L (ref 134–144)
Total Protein: 6.8 g/dL (ref 6.0–8.5)
eGFR: 90 mL/min/{1.73_m2} (ref >59)

## 2021-01-25 LAB — TSH: TSH: 0.921 u[IU]/mL (ref 0.450–4.500)

## 2021-01-25 LAB — HEMOGLOBIN A1C
Est. average glucose Bld gHb Est-mCnc: 214 mg/dL
Hgb A1c MFr Bld: 9.1 % — ABNORMAL HIGH (ref 4.8–5.6)

## 2021-01-25 NOTE — Progress Notes (Signed)
CBC shows mildly elevated WBC, any signs of infection ?  Recheck in one month walk in lab please place order for CBC. Hemoglobin A1C 9.1 by blood.  Glucose is elevated as discussed keep follow up with endocrinology and increase exercises and diet changes.  TSH is within normal limits.  HDL good cholesterol is decreased- diet and exercise advised.  Triglycerides down slightyly and should continue to improve with glucose control.

## 2021-01-29 ENCOUNTER — Ambulatory Visit: Payer: BC Managed Care – PPO | Admitting: Gastroenterology

## 2021-02-01 DIAGNOSIS — E1169 Type 2 diabetes mellitus with other specified complication: Secondary | ICD-10-CM | POA: Diagnosis not present

## 2021-02-01 DIAGNOSIS — E1142 Type 2 diabetes mellitus with diabetic polyneuropathy: Secondary | ICD-10-CM | POA: Diagnosis not present

## 2021-02-01 DIAGNOSIS — Z720 Tobacco use: Secondary | ICD-10-CM | POA: Diagnosis not present

## 2021-02-01 DIAGNOSIS — E1159 Type 2 diabetes mellitus with other circulatory complications: Secondary | ICD-10-CM | POA: Diagnosis not present

## 2021-02-26 ENCOUNTER — Encounter: Payer: Self-pay | Admitting: Adult Health

## 2021-02-26 ENCOUNTER — Other Ambulatory Visit: Payer: Self-pay

## 2021-02-26 ENCOUNTER — Other Ambulatory Visit (HOSPITAL_COMMUNITY)
Admission: RE | Admit: 2021-02-26 | Discharge: 2021-02-26 | Disposition: A | Discharge status: Home / Self Care | Payer: BC Managed Care – PPO | Source: Ambulatory Visit | Attending: Adult Health | Admitting: Adult Health

## 2021-02-26 ENCOUNTER — Ambulatory Visit: Payer: BC Managed Care – PPO | Admitting: Adult Health

## 2021-02-26 VITALS — BP 142/82 | HR 71 | Temp 97.7°F | Ht 65.98 in | Wt 229.6 lb

## 2021-02-26 DIAGNOSIS — I152 Hypertension secondary to endocrine disorders: Secondary | ICD-10-CM

## 2021-02-26 DIAGNOSIS — Z0000 Encounter for general adult medical examination without abnormal findings: Secondary | ICD-10-CM | POA: Insufficient documentation

## 2021-02-26 DIAGNOSIS — E1159 Type 2 diabetes mellitus with other circulatory complications: Secondary | ICD-10-CM

## 2021-02-26 DIAGNOSIS — E1169 Type 2 diabetes mellitus with other specified complication: Secondary | ICD-10-CM

## 2021-02-26 DIAGNOSIS — M62838 Other muscle spasm: Secondary | ICD-10-CM | POA: Diagnosis not present

## 2021-02-26 DIAGNOSIS — E785 Hyperlipidemia, unspecified: Secondary | ICD-10-CM | POA: Diagnosis not present

## 2021-02-26 MED ORDER — CYCLOBENZAPRINE HCL 10 MG PO TABS: 10.0000 mg | ORAL_TABLET | Freq: Three times a day (TID) | ORAL | 0 refills | Status: DC | PRN

## 2021-02-26 NOTE — Progress Notes (Signed)
Established Patient Office Visit  Subjective:  Patient ID: Kristin Cherry, female    DOB: 07-May-1966  Age: 55 y.o. MRN: [id number]  CC:  Chief Complaint  Patient presents with  . Annual Exam    Pt c/o lower back muscle spasms for 1week. Patient wants a pap smear done    HPI Kristin Cherry presents for annual wellness exam. Does have muscle strain left lower back has been present for 2 weeks.  She is seeing endocrinology for her diabetes.  She feels well/ trying to exercise but her  Chronic knee pain which is followed by specialty is limiting her exercise.  Patient's last menstrual period was 10/02/2018 (exact date).   Patient  denies any fever, body aches,chills, rash, chest pain, shortness of breath, nausea, vomiting, or diarrhea.  Denies dizziness, lightheadedness, pre syncopal or syncopal episodes.    Past Medical History:  Diagnosis Date  . Anemia   . Asthma   . Diabetes mellitus without complication ([place])   . History of bronchitis   . Hyperlipidemia   . Hypertension   . Obesity     Past Surgical History:  Procedure Laterality Date  . BREAST BIOPSY Right 11/01/2020   U/S bx, ribbon marker, path pending  . CHOLECYSTECTOMY    . COLONOSCOPY WITH PROPOFOL N/A 12/26/2017   Procedure: COLONOSCOPY WITH PROPOFOL;  Surgeon: Manya Silvas, MD;  Location: [facility] ENDOSCOPY;  Service: Endoscopy;  Laterality: N/A;  . KNEE SURGERY     slow the growth of right knee patient reports in 1979 and left knee cartilage repair in 2000  . SALPINGOOPHORECTOMY Right     Family History  Problem Relation Age of Onset  . Cancer Mother   . Hypertension Mother   . Hypertension Father   . Cancer - Other Father   . Congestive Heart Failure Father   . Cancer Sister   . Diabetes Sister   . Hypertension Sister   . Cancer Brother   . Hypertension Brother   . Diabetes Maternal Grandmother   . Congestive Heart Failure Paternal Grandmother   . Hypertension Paternal Grandfather   .  Diabetes Sister     Social History   Socioeconomic History  . Marital status: Single    Spouse name: Not on file  . Number of children: Not on file  . Years of education: Not on file  . Highest education level: Not on file  Occupational History  . Not on file  Tobacco Use  . Smoking status: Current Every Day Smoker    Packs/day: 0.50    Types: Cigarettes, E-cigarettes  . Smokeless tobacco: Never Used  Vaping Use  . Vaping Use: Never used  Substance and Sexual Activity  . Alcohol use: Not Currently    Comment: rarely  . Drug use: No  . Sexual activity: Yes    Birth control/protection: Post-menopausal  Other Topics Concern  . Not on file  Social History Narrative  . Not on file   Social Determinants of Health   Financial Resource Strain: Not on file  Food Insecurity: Not on file  Transportation Needs: Not on file  Physical Activity: Not on file  Stress: Not on file  Social Connections: Not on file  Intimate Partner Violence: Not on file    Outpatient Medications Prior to Visit  Medication Sig Dispense Refill  . albuterol (VENTOLIN HFA) 108 (90 Base) MCG/ACT inhaler Inhale 2 puffs into the lungs every 6 (six) hours as needed for wheezing or shortness of  breath. 8 g 2  . buPROPion (WELLBUTRIN XL) 300 MG 24 hr tablet TAKE 1 TABLET BY MOUTH EVERY DAY 90 tablet 0  . dapagliflozin propanediol (FARXIGA) 10 MG TABS tablet Take by mouth.    . gabapentin (NEURONTIN) 100 MG capsule Take 1 capsule (100 mg total) by mouth 3 (three) times daily. 180 capsule 1  . glimepiride (AMARYL) 2 MG tablet Take by mouth.    Marland Kitchen omeprazole (PRILOSEC) 40 MG capsule Take 1 capsule (40 mg total) by mouth daily. 30 capsule 2  . rosuvastatin (CRESTOR) 5 MG tablet Take by mouth.    . Semaglutide, 1 MG/DOSE, (OZEMPIC, 1 MG/DOSE,) 4 MG/3ML SOPN Inject 1 mg into the skin once a week. 9 mL 1  . fluticasone (FLONASE) 50 MCG/ACT nasal spray Place 1 spray into both nostrils daily for 7 days. 16 g 2   No  facility-administered medications prior to visit.    Allergies  Allergen Reactions  . Penicillins Hives and Other (See Comments)  . Lodine [Etodolac] Hives    ROS Review of Systems    Objective:    Physical Exam Vitals reviewed.  Constitutional:      General: She is not in acute distress.    Appearance: She is well-developed. She is not diaphoretic.     Interventions: She is not intubated. HENT:     Head: Normocephalic and atraumatic.     Right Ear: External ear normal.     Left Ear: External ear normal.     Nose: Nose normal.     Mouth/Throat:     Pharynx: No oropharyngeal exudate.  Eyes:     General: Lids are normal. No scleral icterus.       Right eye: No discharge.        Left eye: No discharge.     Conjunctiva/sclera: Conjunctivae normal.     Right eye: Right conjunctiva is not injected. No exudate or hemorrhage.    Left eye: Left conjunctiva is not injected. No exudate or hemorrhage.    Pupils: Pupils are equal, round, and reactive to light.  Neck:     Thyroid: No thyroid mass or thyromegaly.     Vascular: Normal carotid pulses. No carotid bruit, hepatojugular reflux or JVD.     Trachea: Trachea and phonation normal. No tracheal tenderness or tracheal deviation.     Meningeal: Brudzinski's sign and Kernig's sign absent.  Cardiovascular:     Rate and Rhythm: Normal rate and regular rhythm.     Pulses: Normal pulses.          Radial pulses are 2+ on the right side and 2+ on the left side.       Dorsalis pedis pulses are 2+ on the right side and 2+ on the left side.       Posterior tibial pulses are 2+ on the right side and 2+ on the left side.     Heart sounds: Normal heart sounds, S1 normal and S2 normal. Heart sounds not distant. No murmur heard. No friction rub. No gallop.   Pulmonary:     Effort: Pulmonary effort is normal. No tachypnea, bradypnea, accessory muscle usage or respiratory distress. She is not intubated.     Breath sounds: Normal breath sounds.  No stridor. No wheezing or rales.  Chest:     Chest wall: No tenderness.  Breasts:     Right: No supraclavicular adenopathy.     Left: No supraclavicular adenopathy.    Abdominal:     General:  Bowel sounds are normal. There is no distension or abdominal bruit.     Palpations: Abdomen is soft. There is no shifting dullness, fluid wave, hepatomegaly, splenomegaly, mass or pulsatile mass.     Tenderness: There is no abdominal tenderness. There is no right CVA tenderness, left CVA tenderness, guarding or rebound.     Hernia: No hernia is present. There is no hernia in the left inguinal area or right inguinal area.  Genitourinary:    General: Normal vulva.     Exam position: Lithotomy position.     Tanner stage (genital): 5.     Labia:        Right: No rash, tenderness, lesion or injury.        Left: No rash, tenderness, lesion or injury.      Urethra: No prolapse, urethral pain, urethral swelling or urethral lesion.     Vagina: Normal.     Cervix: Normal.     Uterus: Normal.      Adnexa: Right adnexa normal and left adnexa normal.     Comments: Declined offered chaperon.  Musculoskeletal:        General: No tenderness or deformity. Normal range of motion.     Cervical back: Full passive range of motion without pain, normal range of motion and neck supple. No edema, erythema or rigidity. No spinous process tenderness or muscular tenderness. Normal range of motion.  Lymphadenopathy:     Head:     Right side of head: No submental, submandibular, tonsillar, preauricular, posterior auricular or occipital adenopathy.     Left side of head: No submental, submandibular, tonsillar, preauricular, posterior auricular or occipital adenopathy.     Cervical: No cervical adenopathy.     Right cervical: No superficial, deep or posterior cervical adenopathy.    Left cervical: No superficial, deep or posterior cervical adenopathy.     Upper Body:     Right upper body: No supraclavicular or pectoral  adenopathy.     Left upper body: No supraclavicular or pectoral adenopathy.     Lower Body: No right inguinal adenopathy. No left inguinal adenopathy.  Skin:    General: Skin is warm and dry.     Coloration: Skin is not pale.     Findings: No abrasion, bruising, burn, ecchymosis, erythema, lesion, petechiae or rash.     Nails: There is no clubbing.  Neurological:     General: No focal deficit present.     Mental Status: She is alert and oriented to person, place, and time.     GCS: GCS eye subscore is 4. GCS verbal subscore is 5. GCS motor subscore is 6.     Cranial Nerves: No cranial nerve deficit.     Sensory: No sensory deficit.     Motor: No tremor, atrophy, abnormal muscle tone or seizure activity.     Coordination: Coordination normal.     Gait: Gait normal.     Deep Tendon Reflexes: Reflexes are normal and symmetric.     Reflex Scores:      Tricep reflexes are 2+ on the right side and 2+ on the left side.      Bicep reflexes are 2+ on the right side and 2+ on the left side.      Brachioradialis reflexes are 2+ on the right side and 2+ on the left side.      Patellar reflexes are 2+ on the right side and 2+ on the left side.      Achilles reflexes  are 2+ on the right side and 2+ on the left side. Psychiatric:        Mood and Affect: Mood normal.        Speech: Speech normal.        Behavior: Behavior normal.        Thought Content: Thought content normal.        Judgment: Judgment normal.     BP (!) 142/82 (BP Location: Left Arm, Patient Position: Sitting)   Pulse 71   Temp 97.7 F (36.5 C)   Ht 5' 5.98" (1.676 m)   Wt 229 lb 9.6 oz (104.1 kg)   LMP 10/02/2018 (Exact Date)   SpO2 95%   BMI 37.08 kg/m  Wt Readings from Last 3 Encounters:  02/26/21 229 lb 9.6 oz (104.1 kg)  01/24/21 234 lb 12.8 oz (106.5 kg)  07/27/20 239 lb 12.8 oz (108.8 kg)     Health Maintenance Due  Topic Date Due  . PNEUMOCOCCAL POLYSACCHARIDE VACCINE AGE 18-64 HIGH RISK  Never done  .  FOOT EXAM  Never done  . OPHTHALMOLOGY EXAM  Never done  . HIV Screening  Never done  . PAP SMEAR-Modifier  Never done  . COVID-19 Vaccine (3 - Booster for Pfizer series) 09/08/2020  . URINE MICROALBUMIN  03/07/2021    There are no preventive care reminders to display for this patient.  Lab Results  Component Value Date   TSH 0.921 01/24/2021   Lab Results  Component Value Date   WBC 11.1 (H) 01/24/2021   HGB 15.6 01/24/2021   HCT 47.0 (H) 01/24/2021   MCV 91 01/24/2021   PLT 278 01/24/2021   Lab Results  Component Value Date   NA 141 01/24/2021   K 4.2 01/24/2021   CO2 22 01/24/2021   GLUCOSE 146 (H) 01/24/2021   BUN 13 01/24/2021   CREATININE 0.78 01/24/2021   BILITOT 0.3 01/24/2021   ALKPHOS 107 01/24/2021   AST 14 01/24/2021   ALT 21 01/24/2021   PROT 6.8 01/24/2021   ALBUMIN 4.5 01/24/2021   CALCIUM 9.5 01/24/2021   ANIONGAP 11 05/03/2019   EGFR 90 01/24/2021   Lab Results  Component Value Date   CHOL 153 01/24/2021   Lab Results  Component Value Date   HDL 37 (L) 01/24/2021   Lab Results  Component Value Date   LDLCALC 89 01/24/2021   Lab Results  Component Value Date   TRIG 157 (H) 01/24/2021   Lab Results  Component Value Date   CHOLHDL 4.6 (H) 03/07/2020   Lab Results  Component Value Date   HGBA1C 9.1 (H) 01/24/2021      Assessment & Plan:   Problem List Items Addressed This Visit      Cardiovascular and Mediastinum   Hypertension associated with diabetes ([place])   Relevant Orders   CBC with Differential/Platelet   Comprehensive Metabolic Panel (CMET)   TSH   HgB A1c   Microalbumin, urine   Urinalysis, microscopic only   Lipid Panel w/o Chol/HDL Ratio     Endocrine   Hyperlipidemia associated with type 2 diabetes mellitus ([facility])   Relevant Orders   CBC with Differential/Platelet   Comprehensive Metabolic Panel (CMET)   TSH   HgB A1c   Microalbumin, urine   Urinalysis, microscopic only   Lipid Panel w/o Chol/HDL Ratio     Other Visit Diagnoses    Annual physical exam    -  Primary   Relevant Orders   Cytology - PAP  Muscle spasm       Relevant Orders   CBC with Differential/Platelet   Comprehensive Metabolic Panel (CMET)   TSH   HgB A1c   Microalbumin, urine   Urinalysis, microscopic only   Lipid Panel w/o Chol/HDL Ratio      Annual physical exam - Plan: Cytology - PAP  Muscle spasm - Plan: CBC with Differential/Platelet, Comprehensive Metabolic Panel (CMET), TSH, HgB A1c, Microalbumin, urine, Urinalysis, microscopic only, Lipid Panel w/o Chol/HDL Ratio  Hyperlipidemia associated with type 2 diabetes mellitus ([facility]) - Plan: CBC with Differential/Platelet, Comprehensive Metabolic Panel (CMET), TSH, HgB A1c, Microalbumin, urine, Urinalysis, microscopic only, Lipid Panel w/o Chol/HDL Ratio  Hypertension associated with diabetes ([facility]) - Plan: CBC with Differential/Platelet, Comprehensive Metabolic Panel (CMET), TSH, HgB A1c, Microalbumin, urine, Urinalysis, microscopic only, Lipid Panel w/o Chol/HDL Ratio    Meds ordered this encounter  Medications  . cyclobenzaprine (FLEXERIL) 10 MG tablet    Sig: Take 1 tablet (10 mg total) by mouth 3 (three) times daily as needed for muscle spasms (will cause drowsiness.).    Dispense:  30 tablet    Refill:  0   Blood pressure slightly elevated today goal is 130/80.   Still needs mammogram has been ordered and she will call to schedule.  She is set up to have previously ordered colonoscopy.  Will try muscle relaxer, heat and ice alternating and rest as well as NSAID PRN. Return if worsening or not resolved completely.  Meds ordered this encounter  Medications  . cyclobenzaprine (FLEXERIL) 10 MG tablet    Sig: Take 1 tablet (10 mg total) by mouth 3 (three) times daily as needed for muscle spasms (will cause drowsiness.).    Dispense:  30 tablet    Refill:  0   Denies need for refills.  Red Flags discussed. The patient was given clear instructions to go to  ER or return to medical center if any red flags develop, symptoms do not improve, worsen or new problems develop. They verbalized understanding. The entirety of the information documented in the History of Present Illness, Review of Systems and Physical Exam were personally obtained by me. Portions of this information were initially documented by the CMA and reviewed by me for thoroughness and accuracy.    Follow-up: Return in about 6 years (around 02/27/2027), or if symptoms worsen or fail to improve, for at any time for any worsening symptoms.    Marcille Buffy, FNP

## 2021-02-26 NOTE — Patient Instructions (Addendum)
Call to schedule your screening mammogram. Your orders have been placed for your exam.  Let our office know if you have questions, concerns, or any difficulty scheduling.  If normal results then yearly screening mammograms are recommended unless you notice  Changes in your breast then you should schedule a follow up office visit. If abnormal results  Further imaging will be warranted and sooner follow up as determined by the radiologist at the Breast Center.   [facility] [address] [place], [place] [address]  Main: [phone]     Health Maintenance, Female Adopting a healthy lifestyle and getting preventive care are important in promoting health and wellness. Ask your health care provider about:  The right schedule for you to have regular tests and exams.  Things you can do on your own to prevent diseases and keep yourself healthy. What should I know about diet, weight, and exercise? Eat a healthy diet  Eat a diet that includes plenty of vegetables, fruits, low-fat dairy products, and lean protein.  Do not eat a lot of foods that are high in solid fats, added sugars, or sodium.   Maintain a healthy weight Body mass index (BMI) is used to identify weight problems. It estimates body fat based on height and weight. Your health care provider can help determine your BMI and help you achieve or maintain a healthy weight. Get regular exercise Get regular exercise. This is one of the most important things you can do for your health. Most adults should:  Exercise for at least 150 minutes each week. The exercise should increase your heart rate and make you sweat (moderate-intensity exercise).  Do strengthening exercises at least twice a week. This is in addition to the moderate-intensity exercise.  Spend less time sitting. Even light physical activity can be beneficial. Watch cholesterol and blood lipids Have your blood tested for lipids and  cholesterol at 55 years of age, then have this test every 5 years. Have your cholesterol levels checked more often if:  Your lipid or cholesterol levels are high.  You are older than 55 years of age.  You are at high risk for heart disease. What should I know about cancer screening? Depending on your health history and family history, you may need to have cancer screening at various ages. This may include screening for:  Breast cancer.  Cervical cancer.  Colorectal cancer.  Skin cancer.  Lung cancer. What should I know about heart disease, diabetes, and high blood pressure? Blood pressure and heart disease  High blood pressure causes heart disease and increases the risk of stroke. This is more likely to develop in people who have high blood pressure readings, are of African descent, or are overweight.  Have your blood pressure checked: ? Every 3-5 years if you are 18-39 years of age. ? Every year if you are 40 years old or older. Diabetes Have regular diabetes screenings. This checks your fasting blood sugar level. Have the screening done:  Once every three years after age 40 if you are at a normal weight and have a low risk for diabetes.  More often and at a younger age if you are overweight or have a high risk for diabetes. What should I know about preventing infection? Hepatitis B If you have a higher risk for hepatitis B, you should be screened for this virus. Talk with your health care provider to find out if you are at risk for hepatitis B infection. Hepatitis   C Testing is recommended for:  Everyone born from 1945 through 1965.  Anyone with known risk factors for hepatitis C. Sexually transmitted infections (STIs)  Get screened for STIs, including gonorrhea and chlamydia, if: ? You are sexually active and are younger than 55 years of age. ? You are older than 55 years of age and your health care provider tells you that you are at risk for this type of  infection. ? Your sexual activity has changed since you were last screened, and you are at increased risk for chlamydia or gonorrhea. Ask your health care provider if you are at risk.  Ask your health care provider about whether you are at high risk for HIV. Your health care provider may recommend a prescription medicine to help prevent HIV infection. If you choose to take medicine to prevent HIV, you should first get tested for HIV. You should then be tested every 3 months for as long as you are taking the medicine. Pregnancy  If you are about to stop having your period (premenopausal) and you may become pregnant, seek counseling before you get pregnant.  Take 400 to 800 micrograms (mcg) of folic acid every day if you become pregnant.  Ask for birth control (contraception) if you want to prevent pregnancy. Osteoporosis and menopause Osteoporosis is a disease in which the bones lose minerals and strength with aging. This can result in bone fractures. If you are 65 years old or older, or if you are at risk for osteoporosis and fractures, ask your health care provider if you should:  Be screened for bone loss.  Take a calcium or vitamin D supplement to lower your risk of fractures.  Be given hormone replacement therapy (HRT) to treat symptoms of menopause. Follow these instructions at home: Lifestyle  Do not use any products that contain nicotine or tobacco, such as cigarettes, e-cigarettes, and chewing tobacco. If you need help quitting, ask your health care provider.  Do not use street drugs.  Do not share needles.  Ask your health care provider for help if you need support or information about quitting drugs. Alcohol use  Do not drink alcohol if: ? Your health care provider tells you not to drink. ? You are pregnant, may be pregnant, or are planning to become pregnant.  If you drink alcohol: ? Limit how much you use to 0-1 drink a day. ? Limit intake if you are  breastfeeding.  Be aware of how much alcohol is in your drink. In the [place] one drink equals one 12 oz bottle of beer (355 mL), one 5 oz glass of wine (148 mL), or one 1 oz glass of hard liquor (44 mL). General instructions  Schedule regular health, dental, and eye exams.  Stay current with your vaccines.  Tell your health care provider if: ? You often feel depressed. ? You have ever been abused or do not feel safe at home. Summary  Adopting a healthy lifestyle and getting preventive care are important in promoting health and wellness.  Follow your health care provider's instructions about healthy diet, exercising, and getting tested or screened for diseases.  Follow your health care provider's instructions on monitoring your cholesterol and blood pressure. This information is not intended to replace advice given to you by your health care provider. Make sure you discuss any questions you have with your health care provider. Document Revised: 10/14/2018 Document Reviewed: 10/14/2018 Elsevier Patient Education  2021 Elsevier Inc.  

## 2021-02-27 LAB — CBC WITH DIFFERENTIAL/PLATELET
Basophils Absolute: 0 10*3/uL (ref 0.0–0.2)
Basos: 0 %
EOS (ABSOLUTE): 0.2 10*3/uL (ref 0.0–0.4)
Eos: 2 %
Hematocrit: 47.1 % — ABNORMAL HIGH (ref 34.0–46.6)
Hemoglobin: 15.5 g/dL (ref 11.1–15.9)
Immature Grans (Abs): 0 10*3/uL (ref 0.0–0.1)
Immature Granulocytes: 0 %
Lymphocytes Absolute: 2.7 10*3/uL (ref 0.7–3.1)
Lymphs: 27 %
MCH: 29.5 pg (ref 26.6–33.0)
MCHC: 32.9 g/dL (ref 31.5–35.7)
MCV: 90 fL (ref 79–97)
Monocytes Absolute: 0.7 10*3/uL (ref 0.1–0.9)
Monocytes: 6 %
Neutrophils Absolute: 6.6 10*3/uL (ref 1.4–7.0)
Neutrophils: 65 %
Platelets: 271 10*3/uL (ref 150–450)
RBC: 5.26 x10E6/uL (ref 3.77–5.28)
RDW: 12.4 % (ref 11.7–15.4)
WBC: 10.1 10*3/uL (ref 3.4–10.8)

## 2021-02-27 LAB — COMPREHENSIVE METABOLIC PANEL
ALT: 15 IU/L (ref 0–32)
AST: 14 IU/L (ref 0–40)
Albumin/Globulin Ratio: 1.7 (ref 1.2–2.2)
Albumin: 4.3 g/dL (ref 3.8–4.9)
Alkaline Phosphatase: 103 IU/L (ref 44–121)
BUN/Creatinine Ratio: 21 (ref 9–23)
BUN: 15 mg/dL (ref 6–24)
Bilirubin Total: <0.2 mg/dL (ref 0.0–1.2)
CO2: 22 mmol/L (ref 20–29)
Calcium: 9.6 mg/dL (ref 8.7–10.2)
Chloride: 105 mmol/L (ref 96–106)
Creatinine, Ser: 0.7 mg/dL (ref 0.57–1.00)
Globulin, Total: 2.6 g/dL (ref 1.5–4.5)
Glucose: 114 mg/dL — ABNORMAL HIGH (ref 65–99)
Potassium: 4.2 mmol/L (ref 3.5–5.2)
Sodium: 142 mmol/L (ref 134–144)
Total Protein: 6.9 g/dL (ref 6.0–8.5)
eGFR: 102 mL/min/{1.73_m2} (ref >59)

## 2021-02-27 LAB — CYTOLOGY - PAP
Comment: NEGATIVE
Diagnosis: NEGATIVE
High risk HPV: NEGATIVE

## 2021-02-27 LAB — MICROALBUMIN, URINE: Microalbumin, Urine: 5 ug/mL

## 2021-02-27 LAB — HEMOGLOBIN A1C
Est. average glucose Bld gHb Est-mCnc: 180 mg/dL
Hgb A1c MFr Bld: 7.9 % — ABNORMAL HIGH (ref 4.8–5.6)

## 2021-02-27 LAB — LIPID PANEL W/O CHOL/HDL RATIO
Cholesterol, Total: 125 mg/dL (ref 100–199)
HDL: 37 mg/dL — ABNORMAL LOW (ref >39)
LDL Chol Calc (NIH): 60 mg/dL (ref 0–99)
Triglycerides: 162 mg/dL — ABNORMAL HIGH (ref 0–149)
VLDL Cholesterol Cal: 28 mg/dL (ref 5–40)

## 2021-02-27 LAB — TSH: TSH: 1.11 u[IU]/mL (ref 0.450–4.500)

## 2021-02-27 NOTE — Progress Notes (Signed)
Sent message to [place].  CBC ok, glucose elevated still but improved with diabetes management plan. Hemoglobin A1C is 7.9 this is improved.  HDL is low, and triglycerides are elevated, diet and exercise advised and this number will likely improve with diet changes and better glucose control.  TSH for thyroid within normal limits.

## 2021-02-27 NOTE — Progress Notes (Signed)
PAP smear within normal limits, satisfactory for evaluation repeat in 3 years unless clinically indicated sooner.

## 2021-02-28 LAB — URINALYSIS, MICROSCOPIC ONLY
Bacteria, UA: NONE SEEN
Casts: NONE SEEN /lpf
RBC, Urine: NONE SEEN /hpf (ref 0–2)

## 2021-02-28 LAB — SPECIMEN STATUS REPORT

## 2021-02-28 NOTE — Progress Notes (Signed)
Urine microscopic is within normal limits.

## 2021-03-21 ENCOUNTER — Encounter: Payer: Self-pay | Admitting: Adult Health

## 2021-03-21 NOTE — Telephone Encounter (Signed)
Called and spoke to [place]. [place] scheduled for a video visit with Sharyn Lull on 03/22/2021 at 10am.

## 2021-03-22 ENCOUNTER — Telehealth: Payer: BC Managed Care – PPO | Admitting: Adult Health

## 2021-03-28 IMAGING — MG DIGITAL DIAGNOSTIC BILAT W/ TOMO W/ CAD
8 of 14 series · 8 of 40 positions shown · non-contrast
Comparison: None.

CLINICAL DATA: Patient presents for evaluation of palpable
abnormality within the upper-outer right breast.

EXAM:
DIGITAL DIAGNOSTIC BILATERAL MAMMOGRAM WITH CAD AND TOMO
ULTRASOUND RIGHT BREAST

[L MLO synth-2D]
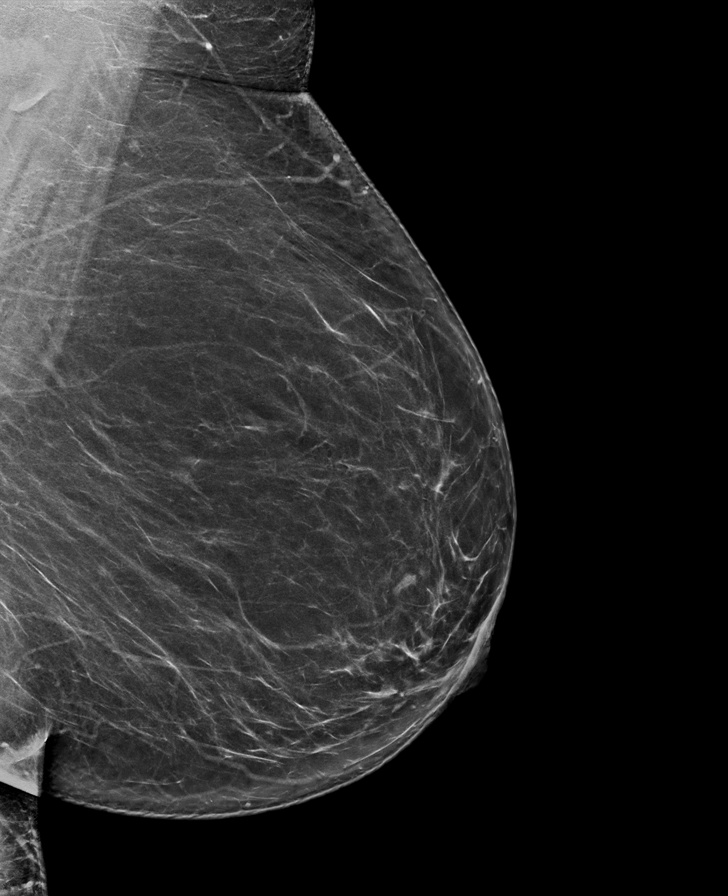

[R TAN synth-2D]
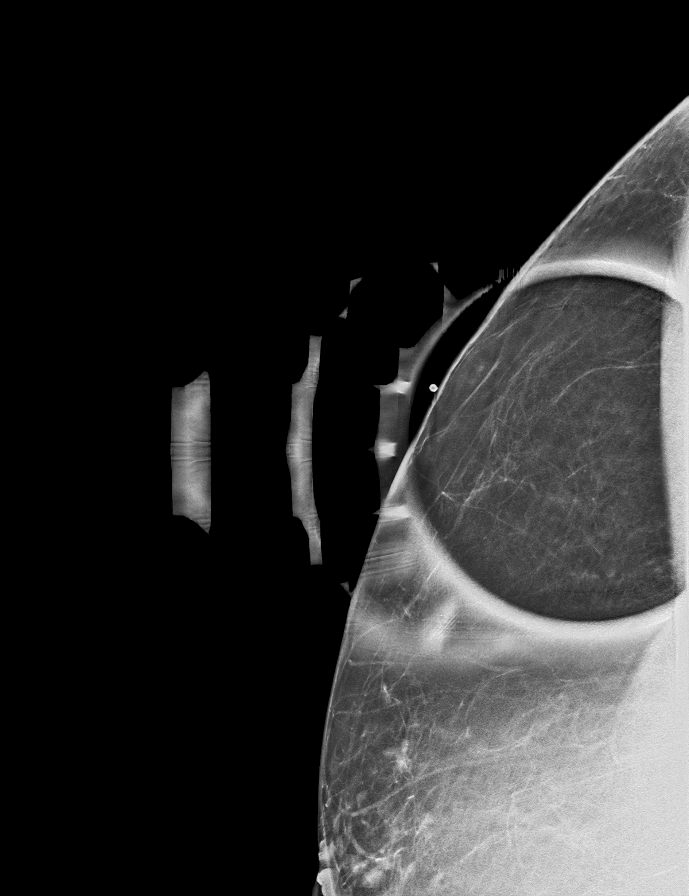

[R MLO synth-2D (1 of 2)]
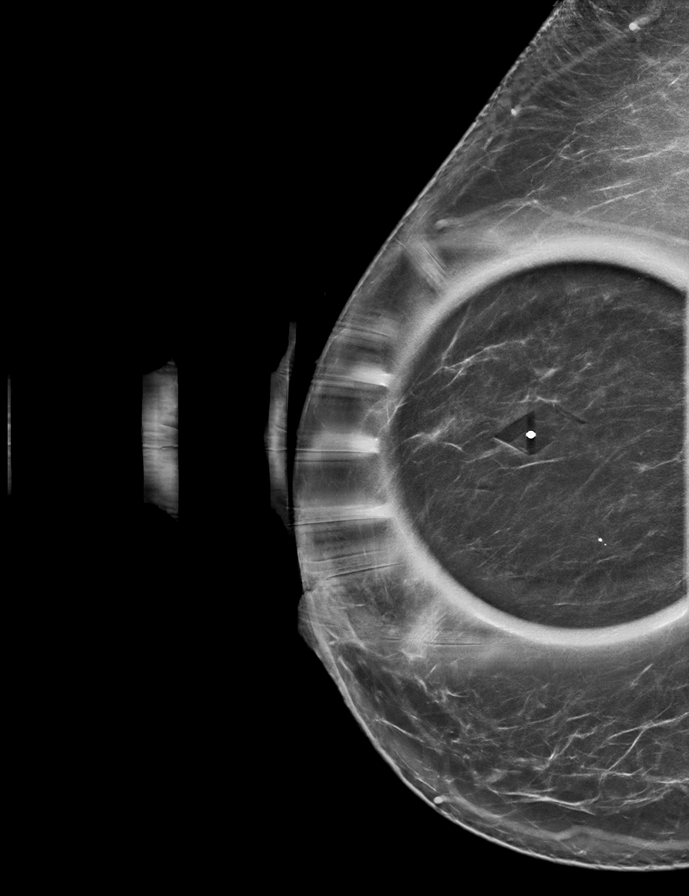

[R MLO synth-2D (2 of 2)]
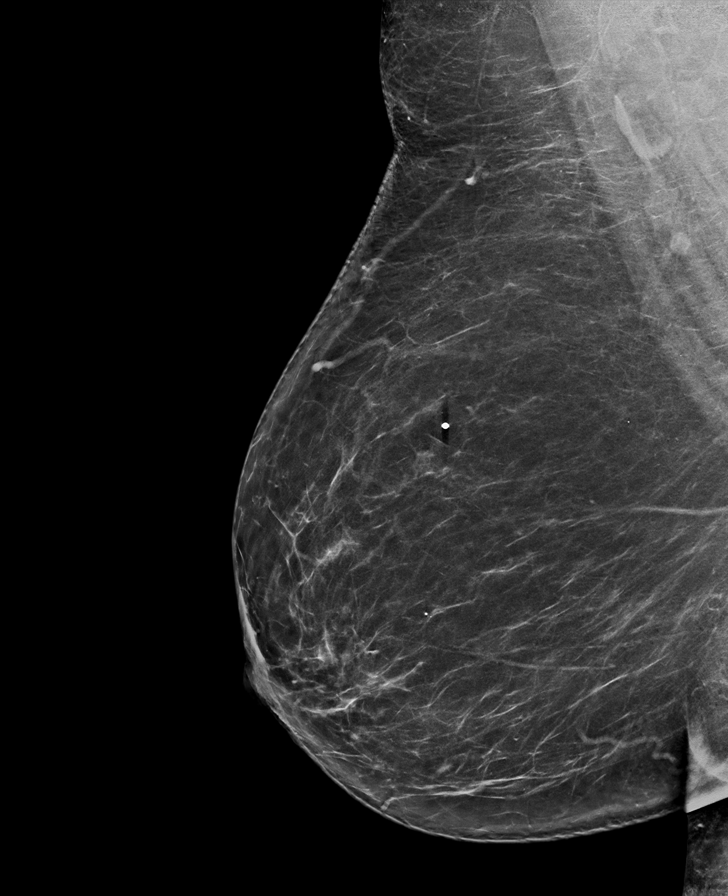

[L CC synth-2D]
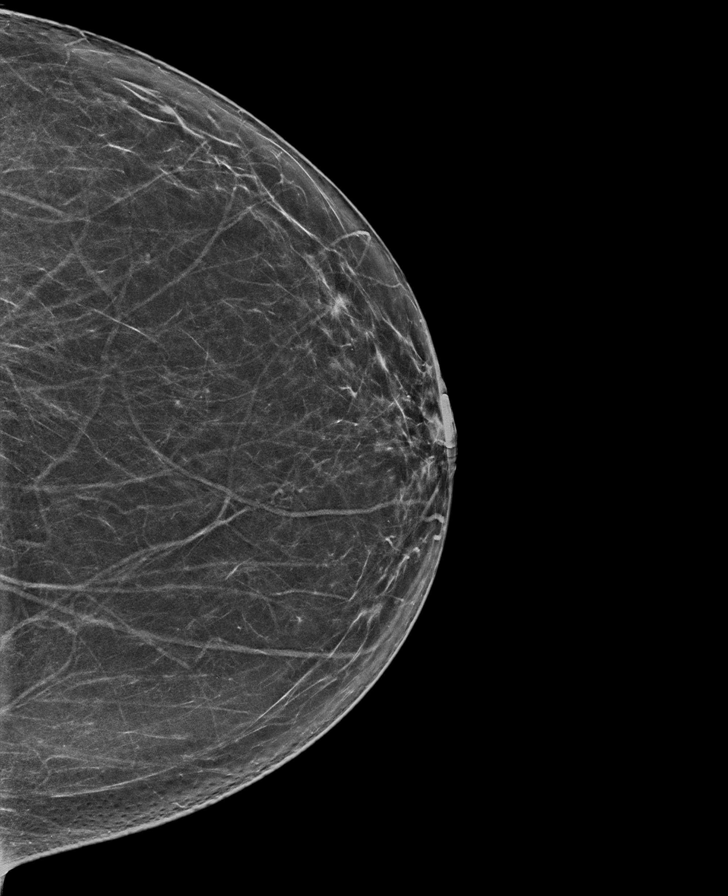

[R CC synth-2D]
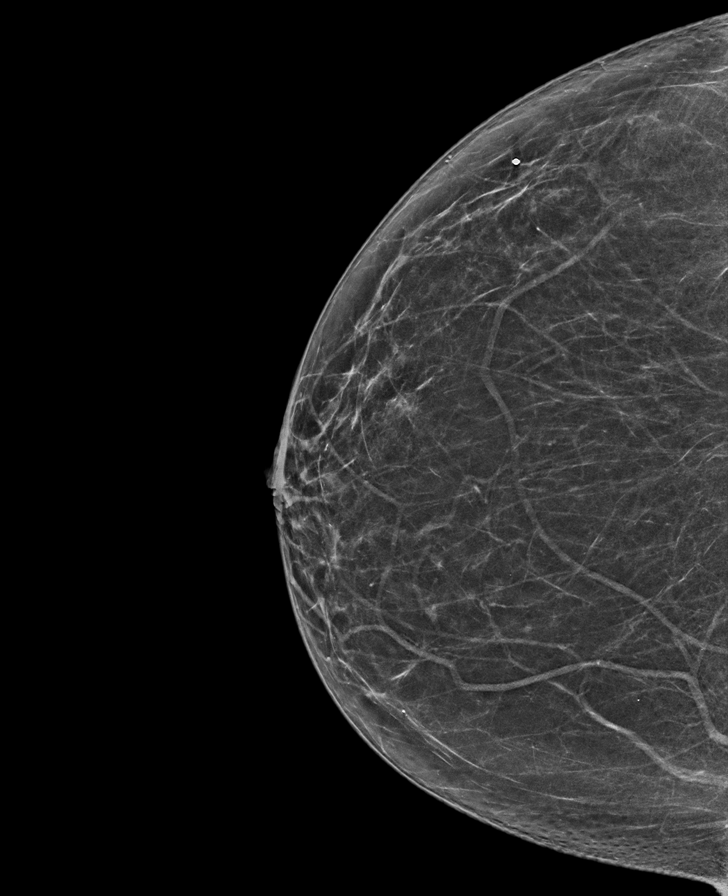

[R ML synth-2D]
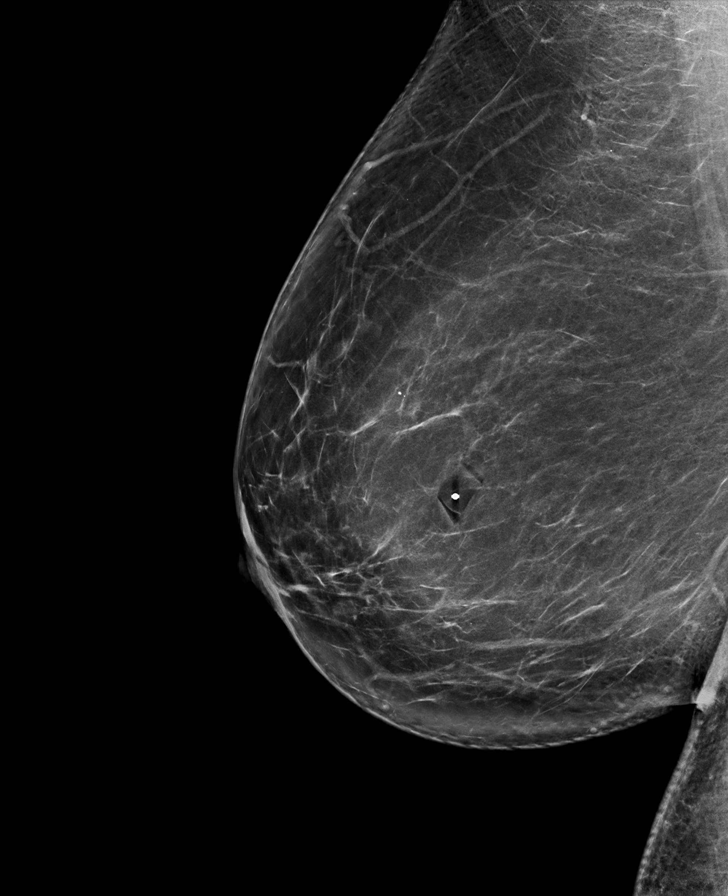

[R CC tomo · tomo slice 33/66.0]
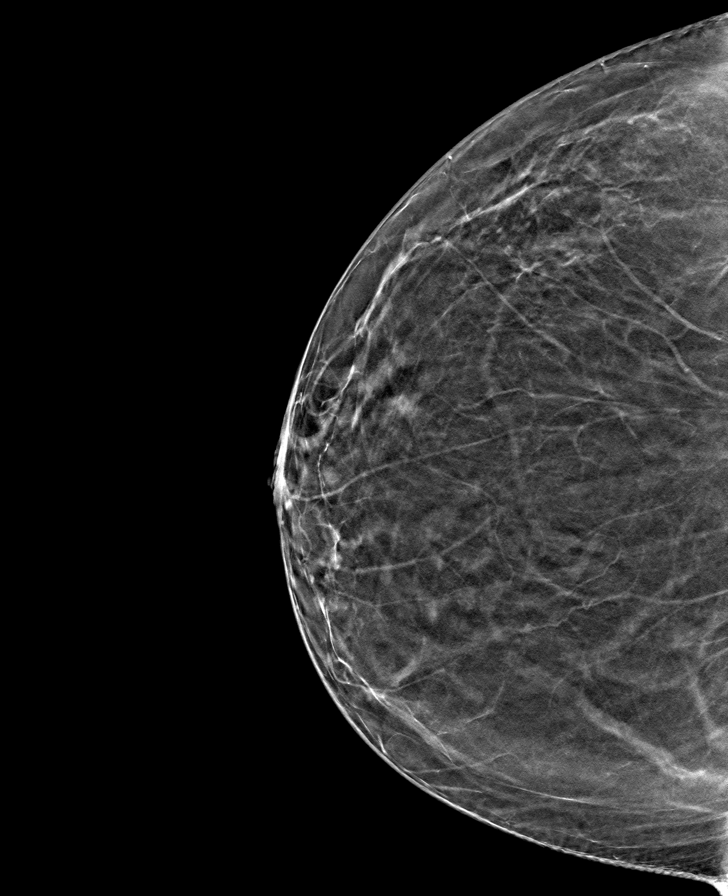

[8 of 40 positions shown; findings below may reference images not displayed]

ACR Breast Density Category b: There are scattered areas of
fibroglandular density.
FINDINGS: No concerning masses, calcifications or distortion identified within
either breast.

Mammographic images were processed with CAD.

On physical exam, a small mobile nodule is palpated within the
upper-outer right breast.

Targeted ultrasound is performed, showing a 2.1 x 0.6 x 1.4 cm
predominately hyperechoic oval mass within the right breast 9
o'clock position 10 cm from nipple at the patient reported site of
palpable concern, potentially representing a lipoma.
IMPRESSION: Probably benign palpable mass right breast favored to represent a
lipoma.

RECOMMENDATION:
Continued clinical evaluation for right breast palpable area of
concern.

Right breast ultrasound in 6 months to reassess right breast mass 9
o'clock position.

I have discussed the findings and recommendations with the patient.
If applicable, a reminder letter will be sent to the patient
regarding the next appointment.

BI-RADS CATEGORY  3: Probably benign.

## 2021-04-04 ENCOUNTER — Other Ambulatory Visit: Payer: Self-pay | Admitting: Surgery

## 2021-04-04 DIAGNOSIS — M1711 Unilateral primary osteoarthritis, right knee: Secondary | ICD-10-CM

## 2021-04-04 DIAGNOSIS — S83231D Complex tear of medial meniscus, current injury, right knee, subsequent encounter: Secondary | ICD-10-CM

## 2021-04-04 DIAGNOSIS — S83231A Complex tear of medial meniscus, current injury, right knee, initial encounter: Secondary | ICD-10-CM | POA: Insufficient documentation

## 2021-04-15 ENCOUNTER — Ambulatory Visit
Admission: RE | Admit: 2021-04-15 | Discharge: 2021-04-15 | Disposition: A | Discharge status: Home / Self Care | Payer: BC Managed Care – PPO | Source: Ambulatory Visit | Attending: Surgery | Admitting: Surgery

## 2021-04-15 ENCOUNTER — Other Ambulatory Visit: Payer: Self-pay

## 2021-04-15 DIAGNOSIS — M1711 Unilateral primary osteoarthritis, right knee: Secondary | ICD-10-CM | POA: Insufficient documentation

## 2021-04-15 DIAGNOSIS — M25561 Pain in right knee: Secondary | ICD-10-CM | POA: Diagnosis not present

## 2021-04-15 DIAGNOSIS — S83231D Complex tear of medial meniscus, current injury, right knee, subsequent encounter: Secondary | ICD-10-CM | POA: Diagnosis not present

## 2021-04-22 ENCOUNTER — Other Ambulatory Visit: Payer: Self-pay | Admitting: Adult Health

## 2021-04-23 ENCOUNTER — Other Ambulatory Visit: Payer: Self-pay | Admitting: Adult Health

## 2021-04-26 ENCOUNTER — Other Ambulatory Visit: Payer: Self-pay

## 2021-04-26 ENCOUNTER — Other Ambulatory Visit: Payer: Self-pay | Admitting: Adult Health

## 2021-04-26 ENCOUNTER — Encounter: Payer: Self-pay | Admitting: Adult Health

## 2021-04-26 MED ORDER — BUPROPION HCL ER (XL) 300 MG PO TB24: 1.0000 | ORAL_TABLET | Freq: Every day | ORAL | 0 refills | Status: DC

## 2021-04-26 NOTE — Telephone Encounter (Signed)
Rx called to advise PT is requesting a refill of their:  omeprazole (PRILOSEC) 40 MG capsule buPROPion (WELLBUTRIN XL) 300 MG 24 hr tablet

## 2021-04-27 ENCOUNTER — Other Ambulatory Visit: Payer: Self-pay | Admitting: Adult Health

## 2021-05-09 ENCOUNTER — Other Ambulatory Visit: Payer: Self-pay

## 2021-05-09 MED ORDER — OMEPRAZOLE 40 MG PO CPDR: 40.0000 mg | DELAYED_RELEASE_CAPSULE | Freq: Every day | ORAL | 2 refills | Status: DC

## 2021-05-30 DIAGNOSIS — Z86010 Personal history of colonic polyps: Secondary | ICD-10-CM | POA: Diagnosis not present

## 2021-05-30 DIAGNOSIS — R1310 Dysphagia, unspecified: Secondary | ICD-10-CM | POA: Diagnosis not present

## 2021-05-30 DIAGNOSIS — K219 Gastro-esophageal reflux disease without esophagitis: Secondary | ICD-10-CM | POA: Diagnosis not present

## 2021-06-05 DIAGNOSIS — E1169 Type 2 diabetes mellitus with other specified complication: Secondary | ICD-10-CM | POA: Diagnosis not present

## 2021-06-05 DIAGNOSIS — E1142 Type 2 diabetes mellitus with diabetic polyneuropathy: Secondary | ICD-10-CM | POA: Diagnosis not present

## 2021-06-05 DIAGNOSIS — E1159 Type 2 diabetes mellitus with other circulatory complications: Secondary | ICD-10-CM | POA: Diagnosis not present

## 2021-06-05 DIAGNOSIS — Z720 Tobacco use: Secondary | ICD-10-CM | POA: Diagnosis not present

## 2021-06-29 DIAGNOSIS — S83231D Complex tear of medial meniscus, current injury, right knee, subsequent encounter: Secondary | ICD-10-CM | POA: Diagnosis not present

## 2021-06-29 DIAGNOSIS — M1711 Unilateral primary osteoarthritis, right knee: Secondary | ICD-10-CM | POA: Diagnosis not present

## 2021-07-07 ENCOUNTER — Other Ambulatory Visit: Payer: Self-pay | Admitting: Adult Health

## 2021-07-31 ENCOUNTER — Other Ambulatory Visit: Payer: Self-pay | Admitting: Adult Health

## 2021-08-28 ENCOUNTER — Ambulatory Visit: Payer: BC Managed Care – PPO | Admitting: Adult Health

## 2021-09-14 ENCOUNTER — Encounter: Admission: RE | Payer: Self-pay | Source: Home / Self Care

## 2021-09-14 ENCOUNTER — Ambulatory Visit: Admission: RE | Admit: 2021-09-14 | Payer: BC Managed Care – PPO | Source: Home / Self Care

## 2021-09-14 SURGERY — COLONOSCOPY
Anesthesia: General

## 2021-11-12 NOTE — Progress Notes (Signed)
Established Patient Office Visit  Subjective:  Patient ID: Kristin Cherry, female    DOB: 05/22/1966  Age: 56 y.o. MRN: [id number]  CC:  Chief Complaint  Patient presents with   Follow-up    HPI Kristin Cherry presents for follow up.  She is seeing endocrinology for diabetes.   She has switched to Nexium over the counter - she feels this helps her better. She was scheduled for endoscopy and colonoscopy and reschedule. Denies ay hemoptysis, bloody or tarry stools.    Patient  denies any fever, body aches,chills, rash, chest pain, shortness of breath, nausea, vomiting, or diarrhea.  Denies dizziness, lightheadedness, pre syncopal or syncopal episodes.     Past Medical History:  Diagnosis Date   Anemia    Asthma    Diabetes mellitus without complication ([place])    History of bronchitis    Hyperlipidemia    Hypertension    Obesity     Past Surgical History:  Procedure Laterality Date   BREAST BIOPSY Right 11/01/2020   U/S bx, ribbon marker, path pending   CHOLECYSTECTOMY     COLONOSCOPY WITH PROPOFOL N/A 12/26/2017   Procedure: COLONOSCOPY WITH PROPOFOL;  Surgeon: Manya Silvas, MD;  Location: [facility] ENDOSCOPY;  Service: Endoscopy;  Laterality: N/A;   KNEE SURGERY     slow the growth of right knee patient reports in 1979 and left knee cartilage repair in 2000   SALPINGOOPHORECTOMY Right     Family History  Problem Relation Age of Onset   Cancer Mother    Hypertension Mother    Hypertension Father    Cancer - Other Father    Congestive Heart Failure Father    Cancer Sister    Diabetes Sister    Hypertension Sister    Cancer Brother    Hypertension Brother    Diabetes Maternal Grandmother    Congestive Heart Failure Paternal Grandmother    Hypertension Paternal Grandfather    Diabetes Sister     Social History   Socioeconomic History   Marital status: Single    Spouse name: Not on file   Number of children: Not on file   Years of education: Not on  file   Highest education level: Not on file  Occupational History   Not on file  Tobacco Use   Smoking status: Every Day    Packs/day: 0.50    Types: Cigarettes, E-cigarettes   Smokeless tobacco: Never   Tobacco comments:    Risks of continued tobacco use were discussed. She is not currently interested in tobacco cessation.    Vaping Use   Vaping Use: Never used  Substance and Sexual Activity   Alcohol use: Not Currently    Comment: rarely   Drug use: No   Sexual activity: Yes    Birth control/protection: Post-menopausal  Other Topics Concern   Not on file  Social History Narrative   Not on file   Social Determinants of Health   Financial Resource Strain: Not on file  Food Insecurity: Not on file  Transportation Needs: Not on file  Physical Activity: Not on file  Stress: Not on file  Social Connections: Not on file  Intimate Partner Violence: Not on file    Outpatient Medications Prior to Visit  Medication Sig Dispense Refill   glimepiride (AMARYL) 2 MG tablet Take 1 tablet by mouth daily with breakfast.     cyclobenzaprine (FLEXERIL) 10 MG tablet Take by mouth.     rosuvastatin (CRESTOR) 5 MG  tablet Take 1 tablet by mouth daily.     albuterol (VENTOLIN HFA) 108 (90 Base) MCG/ACT inhaler Inhale 2 puffs into the lungs every 6 (six) hours as needed for wheezing or shortness of breath. 8 g 2   buPROPion (WELLBUTRIN XL) 300 MG 24 hr tablet Take 1 tablet (300 mg total) by mouth daily. 90 tablet 0   cyclobenzaprine (FLEXERIL) 10 MG tablet Take 1 tablet (10 mg total) by mouth 3 (three) times daily as needed for muscle spasms (will cause drowsiness.). 30 tablet 0   FARXIGA 10 MG TABS tablet Take 10 mg by mouth daily.     fluticasone (FLONASE) 50 MCG/ACT nasal spray Place 1 spray into both nostrils daily for 7 days. 16 g 2   gabapentin (NEURONTIN) 100 MG capsule Take 1 capsule (100 mg total) by mouth 3 (three) times daily. 180 capsule 1   OZEMPIC, 2 MG/DOSE, 8 MG/3ML SOPN Inject  into the skin.     famotidine (PEPCID) 40 MG tablet Take 40 mg by mouth at bedtime.     glimepiride (AMARYL) 2 MG tablet Take by mouth.     omeprazole (PRILOSEC) 40 MG capsule Take 1 capsule (40 mg total) by mouth daily. 30 capsule 2   rosuvastatin (CRESTOR) 5 MG tablet Take by mouth.     Semaglutide, 1 MG/DOSE, (OZEMPIC, 1 MG/DOSE,) 4 MG/3ML SOPN Inject 1 mg into the skin once a week. 9 mL 1   No facility-administered medications prior to visit.    Allergies  Allergen Reactions   Penicillins Hives and Other (See Comments)   Lodine [Etodolac] Hives    ROS Review of Systems  Constitutional: Negative.   HENT: Negative.    Respiratory: Negative.    Cardiovascular: Negative.   Gastrointestinal: Negative.        GERD   Genitourinary: Negative.   Musculoskeletal: Negative.   Neurological: Negative.   Psychiatric/Behavioral: Negative.       Objective:    Physical Exam Vitals reviewed.  Constitutional:      Appearance: Normal appearance. She is not ill-appearing.  HENT:     Head: Normocephalic and atraumatic.  Musculoskeletal:     Right lower leg: No edema.     Left lower leg: No edema.  Neurological:     Mental Status: She is alert and oriented to person, place, and time.  Psychiatric:        Mood and Affect: Mood normal.        Behavior: Behavior normal.        Thought Content: Thought content normal.        Judgment: Judgment normal.     General: Appearance:    Obese female in no acute distress  Eyes:    PERRL, conjunctiva/corneas clear, EOM's intact       Lungs:     Clear to auscultation bilaterally, respirations unlabored  Heart:    Normal heart rate. Normal rhythm. No murmurs, rubs, or gallops.    MS:   All extremities are intact.    Neurologic:   Awake, alert, oriented x 3. No apparent focal neurological           defect.     BP 126/74    Pulse 66    Temp (!) 97.2 F (36.2 C)    Resp 16    Ht _0  (1.676 m)    Wt 230 lb (104.3 kg)    LMP 10/02/2018 (Exact  Date)    SpO2 98%    BMI  37.12 kg/m  Wt Readings from Last 3 Encounters:  11/13/21 230 lb (104.3 kg)  02/26/21 229 lb 9.6 oz (104.1 kg)  01/24/21 234 lb 12.8 oz (106.5 kg)     Health Maintenance Due  Topic Date Due   Pneumococcal Vaccine 66-59 Years old (1 - PCV) Never done   HIV Screening  Never done   Hepatitis C Screening  Never done   TETANUS/TDAP  Never done   HEMOGLOBIN A1C  08/28/2021    There are no preventive care reminders to display for this patient.  Lab Results  Component Value Date   TSH 1.110 02/26/2021   Lab Results  Component Value Date   WBC 10.1 02/26/2021   HGB 15.5 02/26/2021   HCT 47.1 (H) 02/26/2021   MCV 90 02/26/2021   PLT 271 02/26/2021   Lab Results  Component Value Date   NA 142 02/26/2021   K 4.2 02/26/2021   CO2 22 02/26/2021   GLUCOSE 114 (H) 02/26/2021   BUN 15 02/26/2021   CREATININE 0.70 02/26/2021   BILITOT <0.2 02/26/2021   ALKPHOS 103 02/26/2021   AST 14 02/26/2021   ALT 15 02/26/2021   PROT 6.9 02/26/2021   ALBUMIN 4.3 02/26/2021   CALCIUM 9.6 02/26/2021   ANIONGAP 11 05/03/2019   EGFR 102 02/26/2021   Lab Results  Component Value Date   CHOL 125 02/26/2021   Lab Results  Component Value Date   HDL 37 (L) 02/26/2021   Lab Results  Component Value Date   LDLCALC 60 02/26/2021   Lab Results  Component Value Date   TRIG 162 (H) 02/26/2021   Lab Results  Component Value Date   CHOLHDL 4.6 (H) 03/07/2020   Lab Results  Component Value Date   HGBA1C 7.9 (H) 02/26/2021      Assessment & Plan:   Problem List Items Addressed This Visit       Digestive   Gastroesophageal reflux disease    Wanted to change to Nexium. Has to call and reschedule her endoscopy and colonoscopy this is highly advised.       Relevant Medications   esomeprazole (NEXIUM) 40 MG packet   Other Relevant Orders   CBC with Differential/Platelet   Comprehensive metabolic panel   Lipid panel   Hgb A1c w/o eAG   TSH      Endocrine   Controlled type 2 diabetes mellitus with complication, without long-term current use of insulin ([place])    Followed by Dr. Mee Hives stable compliant with medication therapy.  farxiga and Ozempic.       Relevant Medications   FARXIGA 10 MG TABS tablet   glimepiride (AMARYL) 2 MG tablet   OZEMPIC, 2 MG/DOSE, 8 MG/3ML SOPN   rosuvastatin (CRESTOR) 5 MG tablet   Other Relevant Orders   Hgb A1c w/o eAG     Musculoskeletal and Integument   Complex tear of medial meniscus of right knee as current injury    Seeing orthopedics Dr. Kathlen Brunswick.         Other   Routine health maintenance - Primary   Tobacco abuse    11/13/20 Pt was counseled on smoking cessation today for 5 minutes.   was also counseled on the side effects of smoking and vaping.         BMI 37.0-37.9, adult   Hyperlipidemia   Relevant Medications   rosuvastatin (CRESTOR) 5 MG tablet   Need for pneumococcal vaccination    Given 11/12/20.  Need for Tdap vaccination    Return for nurse viist for TDAP. Needs updating noted 11/13/20.       Meds ordered this encounter  Medications   esomeprazole (NEXIUM) 40 MG packet    Sig: Take 40 mg by mouth daily before breakfast.    Dispense:  30 each    Refill:  12   rosuvastatin (CRESTOR) 5 MG tablet    Sig: Take 1 tablet (5 mg total) by mouth daily.    Dispense:  90 tablet    Refill:  3     Red Flags discussed. The patient was given clear instructions to go to ER or return to medical center if any red flags develop, symptoms do not improve, worsen or new problems develop. They verbalized understanding. Fasting labs today.   Follow-up: Return in about 3 months (around 02/11/2022), or if symptoms worsen or fail to improve, for at any time for any worsening symptoms, Go to Emergency room/ urgent care if worse.    Marcille Buffy, FNP

## 2021-11-13 ENCOUNTER — Ambulatory Visit: Payer: BC Managed Care – PPO | Admitting: Adult Health

## 2021-11-13 ENCOUNTER — Other Ambulatory Visit: Payer: Self-pay

## 2021-11-13 ENCOUNTER — Encounter: Payer: Self-pay | Admitting: Adult Health

## 2021-11-13 ENCOUNTER — Telehealth: Payer: Self-pay | Admitting: Pharmacist

## 2021-11-13 VITALS — BP 126/74 | HR 66 | Temp 97.2°F | Resp 16 | Ht 66.0 in | Wt 230.0 lb

## 2021-11-13 DIAGNOSIS — E118 Type 2 diabetes mellitus with unspecified complications: Secondary | ICD-10-CM | POA: Diagnosis not present

## 2021-11-13 DIAGNOSIS — Z6837 Body mass index (BMI) 37.0-37.9, adult: Secondary | ICD-10-CM

## 2021-11-13 DIAGNOSIS — Z720 Tobacco use: Secondary | ICD-10-CM

## 2021-11-13 DIAGNOSIS — Z9189 Other specified personal risk factors, not elsewhere classified: Secondary | ICD-10-CM | POA: Diagnosis not present

## 2021-11-13 DIAGNOSIS — K219 Gastro-esophageal reflux disease without esophagitis: Secondary | ICD-10-CM | POA: Insufficient documentation

## 2021-11-13 DIAGNOSIS — Z1159 Encounter for screening for other viral diseases: Secondary | ICD-10-CM | POA: Diagnosis not present

## 2021-11-13 DIAGNOSIS — E785 Hyperlipidemia, unspecified: Secondary | ICD-10-CM | POA: Diagnosis not present

## 2021-11-13 DIAGNOSIS — S83231D Complex tear of medial meniscus, current injury, right knee, subsequent encounter: Secondary | ICD-10-CM

## 2021-11-13 DIAGNOSIS — Z0000 Encounter for general adult medical examination without abnormal findings: Secondary | ICD-10-CM | POA: Diagnosis not present

## 2021-11-13 DIAGNOSIS — I10 Essential (primary) hypertension: Secondary | ICD-10-CM | POA: Insufficient documentation

## 2021-11-13 DIAGNOSIS — Z23 Encounter for immunization: Secondary | ICD-10-CM | POA: Insufficient documentation

## 2021-11-13 LAB — CBC WITH DIFFERENTIAL/PLATELET
Basophils Absolute: 0 10*3/uL (ref 0.0–0.1)
Basophils Relative: 0.4 % (ref 0.0–3.0)
Eosinophils Absolute: 0.2 10*3/uL (ref 0.0–0.7)
Eosinophils Relative: 2.4 % (ref 0.0–5.0)
HCT: 42.3 % (ref 36.0–46.0)
Hemoglobin: 13.8 g/dL (ref 12.0–15.0)
Lymphocytes Relative: 30.6 % (ref 12.0–46.0)
Lymphs Abs: 2.4 10*3/uL (ref 0.7–4.0)
MCHC: 32.5 g/dL (ref 30.0–36.0)
MCV: 89.3 fl (ref 78.0–100.0)
Monocytes Absolute: 0.7 10*3/uL (ref 0.1–1.0)
Monocytes Relative: 8.8 % (ref 3.0–12.0)
Neutro Abs: 4.5 10*3/uL (ref 1.4–7.7)
Neutrophils Relative %: 57.8 % (ref 43.0–77.0)
Platelets: 223 10*3/uL (ref 150.0–400.0)
RBC: 4.74 Mil/uL (ref 3.87–5.11)
RDW: 12.9 % (ref 11.5–15.5)
WBC: 7.8 10*3/uL (ref 4.0–10.5)

## 2021-11-13 LAB — LIPID PANEL
Cholesterol: 172 mg/dL (ref 0–200)
HDL: 40.5 mg/dL
NonHDL: 131.89
Total CHOL/HDL Ratio: 4
Triglycerides: 235 mg/dL — ABNORMAL HIGH (ref 0.0–149.0)
VLDL: 47 mg/dL — ABNORMAL HIGH (ref 0.0–40.0)

## 2021-11-13 LAB — COMPREHENSIVE METABOLIC PANEL WITH GFR
ALT: 17 U/L (ref 0–35)
AST: 13 U/L (ref 0–37)
Albumin: 4.1 g/dL (ref 3.5–5.2)
Alkaline Phosphatase: 84 U/L (ref 39–117)
BUN: 14 mg/dL (ref 6–23)
CO2: 29 meq/L (ref 19–32)
Calcium: 9.4 mg/dL (ref 8.4–10.5)
Chloride: 105 meq/L (ref 96–112)
Creatinine, Ser: 0.76 mg/dL (ref 0.40–1.20)
GFR: 87.93 mL/min
Glucose, Bld: 111 mg/dL — ABNORMAL HIGH (ref 70–99)
Potassium: 3.9 meq/L (ref 3.5–5.1)
Sodium: 140 meq/L (ref 135–145)
Total Bilirubin: 0.3 mg/dL (ref 0.2–1.2)
Total Protein: 7 g/dL (ref 6.0–8.3)

## 2021-11-13 LAB — LDL CHOLESTEROL, DIRECT: Direct LDL: 110 mg/dL

## 2021-11-13 LAB — TSH: TSH: 1.25 u[IU]/mL (ref 0.35–5.50)

## 2021-11-13 MED ORDER — ESOMEPRAZOLE MAGNESIUM 40 MG PO PACK: 40.0000 mg | PACK | Freq: Every day | ORAL | 12 refills | Status: DC

## 2021-11-13 MED ORDER — ROSUVASTATIN CALCIUM 5 MG PO TABS: 5.0000 mg | ORAL_TABLET | Freq: Every day | ORAL | 3 refills | Status: DC

## 2021-11-13 NOTE — Assessment & Plan Note (Addendum)
Seeing orthopedics Dr. Kathlen Brunswick.

## 2021-11-13 NOTE — Patient Instructions (Addendum)
Gastroenterologist in [place], [place] Address: [address] [place], [place], [place] [address] Phone: [phone]  Return for nurse viist for TDAP.      Rosuvastatin Tablets What is this medication? ROSUVASTATIN (roe SOO va sta tin) treats high cholesterol and reduces the risk of heart attack and stroke. It works by decreasing bad cholesterol and fats (such as LDL, triglycerides), and increasing good cholesterol (HDL) in your blood. It belongs to a group of medications called statins. Changes to diet and exercise are often combined with this medication. This medicine may be used for other purposes; ask your health care provider or pharmacist if you have questions. COMMON BRAND NAME(S): Crestor What should I tell my care team before I take this medication? They need to know if you have any of these conditions: Diabetes (high blood sugar) If you often drink alcohol Kidney disease Liver disease Muscle cramps, pain Stroke Thyroid disease An unusual or allergic reaction to rosuvastatin, other medications, foods, dyes, or preservatives Pregnant or trying to get pregnant Breast-feeding How should I use this medication? Take this medication by mouth with a glass of water. Follow the directions on the prescription label. You can take it with or without food. If it upsets your stomach, take it with food. Do not cut, crush or chew this medication. Swallow the tablets whole. Take your medication at regular intervals. Do not take it more often than directed. Take antacids that have a combination of aluminum and magnesium hydroxide in them at a different time of day than this medication. Take these products 2 hours AFTER this medication. Talk to your care team about the use of this medication in children. While this medication may be prescribed for children as young as 7 for selected conditions, precautions do apply. Overdosage: If you think you have taken too much of this medicine contact a  poison control center or emergency room at once. NOTE: This medicine is only for you. Do not share this medicine with others. What if I miss a dose? If you miss a dose, take it as soon as you can. If your next dose is to be taken in less than 12 hours, then do not take the missed dose. Take the next dose at your regular time. Do not take double or extra doses. What may interact with this medication? Do not take this medication with any of the following: Supplements like red yeast rice This medication may also interact with the following: Alcohol Antacids containing aluminum hydroxide and magnesium hydroxide Cyclosporine Other medications for high cholesterol Some medications for HIV infection Warfarin This list may not describe all possible interactions. Give your health care provider a list of all the medicines, herbs, non-prescription drugs, or dietary supplements you use. Also tell them if you smoke, drink alcohol, or use illegal drugs. Some items may interact with your medicine. What should I watch for while using this medication? Visit your health care provider for regular checks on your progress. Tell your health care provider if your symptoms do not start to get better or if they get worse. Your health care provider may tell you to stop taking this medication if you develop muscle problems. If your muscle problems do not go away after stopping this medication, contact your health care provider. Do not become pregnant while taking this medication. Women should inform their health care provider if they wish to become pregnant or think they might be pregnant. There is potential for serious harm to an unborn child. Talk  to your health care provider for more information. Do not breast-feed an infant while taking this medication. This medication may increase blood sugar. Ask your health care provider if changes in diet or medications are needed if you have diabetes. If you are going to need  surgery or other procedure, tell your health care provider that you are using this medication. Taking this medication is only part of a total heart healthy program. Your health care provider may give you a special diet to follow. Avoid alcohol. Avoid smoking. Ask your health care provider how much you should exercise. What side effects may I notice from receiving this medication? Side effects that you should report to your care team as soon as possible: Allergic reactions--skin rash, itching, hives, swelling of the face, lips, tongue, or throat High blood sugar (hyperglycemia)--increased thirst or amount of urine, unusual weakness, fatigue, blurry vision Liver injury--right upper belly pain, loss of appetite, nausea, light-colored stool, dark yellow or brown urine, yellowing skin or eyes, unusual weakness, fatigue Muscle injury--unusual weakness, fatigue, muscle pain, dark yellow or brown urine, decrease in amount of urine Redness, blistering, peeling or loosening of the skin, including inside the mouth Side effects that usually do not require medical attention (report to your care team if they continue or are bothersome): Fatigue Headache Nausea Stomach pain This list may not describe all possible side effects. Call your doctor for medical advice about side effects. You may report side effects to FDA at 1-800-FDA-1088. Where should I keep my medication? Keep out of the reach of children and pets. Store between 20 and 25 degrees C (68 and 77 degrees F). Get rid of any unused medication after the expiration date. To get rid of medications that are no longer needed or have expired: Take the medication to a medication take-back program. Check with your pharmacy or law enforcement to find a location. If you cannot return the medication, check the label or package insert to see if the medication should be thrown out in the garbage or flushed down the toilet. If you are not sure, ask your care team. If  it is safe to put it in the trash, take the medication out of the container. Mix the medication with cat litter, dirt, coffee grounds, or other unwanted substance. Seal the mixture in a bag or container. Put it in the trash. NOTE: This sheet is a summary. It may not cover all possible information. If you have questions about this medicine, talk to your doctor, pharmacist, or health care provider.  2022 Elsevier/Gold Standard (2020-11-17 00:00:00)  Esomeprazole Capsules What is this medication? ESOMEPRAZOLE (es oh ME pray zol) treats heartburn, stomach ulcers, reflux disease, or other conditions that cause too much stomach acid. It works by reducing the amount of acid in the stomach. It belongs to a group of medications called PPIs. This medicine may be used for other purposes; ask your health care provider or pharmacist if you have questions. COMMON BRAND NAME(S): Nexium, Nexium 24HR, Nexium 24HR Clear Minis What should I tell my care team before I take this medication? They need to know if you have any of these conditions: Liver disease Low levels of calcium, magnesium, or potassium in the blood Lupus An unusual or allergic reaction to esomeprazole, other medications, foods, dyes, or preservatives Pregnant or trying to get pregnant Breast-feeding How should I use this medication? Take this medication by mouth with a glass of water. Follow the directions on the prescription label. Do not cut, crush  or chew this medication. Swallow the capsules whole. You may open the capsule and put the contents in 1 tablespoon of applesauce. Swallow the medication and applesauce right away. Do not chew the medication or applesauce. Take this medication on an empty stomach, at least 1 hour before a meal. Take your medication at regular intervals. Do not take your medication more often than directed. A special MedGuide will be given to you by the pharmacist with each prescription and refill. Be sure to read this  information carefully each time. Talk to your care team regarding the use of this medication in children. While this medication may be prescribed for children as young as 1 year for selected conditions, precautions do apply. Overdosage: If you think you have taken too much of this medicine contact a poison control center or emergency room at once. NOTE: This medicine is only for you. Do not share this medicine with others. What if I miss a dose? If you miss a dose, take it as soon as you can. If it is almost time for your next dose, take only that dose. Do not take double or extra doses. What may interact with this medication? Do not take this medication with any of the following: Atazanavir Clopidogrel Nelfinavir Rilpivirine This medication may also interact with the following: Antifungals such as itraconazole, ketoconazole, and voriconazole Certain antivirals for HIV or hepatitis Certain medications that treat or prevent blood clots such as warfarin Cilostazol Citalopram Dasatinib Digoxin Diuretics Erlotinib Iron supplements Medications for anxiety, panic, and sleep such as diazepam Medications for seizures such as carbamazepine, phenobarbital, phenytoin Methotrexate Mycophenolate mofetil Nilotinib Rifampin St. John's wort Tacrolimus This list may not describe all possible interactions. Give your health care provider a list of all the medicines, herbs, non-prescription drugs, or dietary supplements you use. Also tell them if you smoke, drink alcohol, or use illegal drugs. Some items may interact with your medicine. What should I watch for while using this medication? It can take several days before your stomach pain gets better. Check with your care team if your condition does not start to get better, or if it gets worse. Do not treat diarrhea with over the counter products. Contact your care team if you have diarrhea that lasts more than 2 days or if it is severe and watery. You  may need blood work done while you are taking this medication. Using this medication for a long time may weaken your bones. The risk of bone fractures may be increased. Talk to your care team about your bone health. Using this medication for a long time may cause growths (polyps) in the stomach. They usually don't cause any symptoms. They are usually not cancerous. Contact your care team if you notice pain or tenderness when you press your stomach, have nausea, or see bloody or black, tar-like stools. This medication may cause a decrease in vitamin B12. You should make sure that you get enough vitamin B12 while you are taking this medication. Discuss the foods you eat and the vitamins you take with your care team. What side effects may I notice from receiving this medication? Side effects that you should report to your care team as soon as possible: Allergic reactions--skin rash, itching, hives, swelling of the face, lips, tongue, or throat Kidney injury--decrease in the amount of urine, swelling of the ankles, hands, or feet Low magnesium level--muscle pain or cramps, unusual weakness or fatigue, fast or irregular heartbeat, tremors Low vitamin B12 level--pain, tingling, or numbness  in the hands or feet, muscle weakness, dizziness, confusion, difficulty concentrating Rash on cheeks or arms that gets worse in the sun Redness, blistering, peeling, or loosening of the skin, including inside the mouth Severe diarrhea, fever Unusual bleeding or bruising Side effects that usually do not require medical attention (report to your care team if they continue or are bothersome): Diarrhea Dry mouth Gas Headache Nausea This list may not describe all possible side effects. Call your doctor for medical advice about side effects. You may report side effects to FDA at 1-800-FDA-1088. Where should I keep my medication? Keep out of the reach of children. Store at room temperature between 15 and 30 degrees C (59  and 86 degrees F). Protect from light and moisture. Throw away any unused medication after the expiration date. NOTE: This sheet is a summary. It may not cover all possible information. If you have questions about this medicine, talk to your doctor, pharmacist, or health care provider.  2022 Elsevier/Gold Standard (2021-01-16 00:00:00)

## 2021-11-13 NOTE — Assessment & Plan Note (Signed)
11/13/20 Pt was counseled on smoking cessation today for 5 minutes.   was also counseled on the side effects of smoking and vaping.

## 2021-11-13 NOTE — Assessment & Plan Note (Addendum)
Followed by Dr. Mee Hives stable compliant with medication therapy.  farxiga and Ozempic.

## 2021-11-13 NOTE — Telephone Encounter (Signed)
PA required for esomeprazole. Routing to CMA. Key  [id number]

## 2021-11-13 NOTE — Assessment & Plan Note (Signed)
Given 11/12/20.

## 2021-11-13 NOTE — Assessment & Plan Note (Signed)
Return for nurse viist for TDAP. Needs updating noted 11/13/20.

## 2021-11-13 NOTE — Assessment & Plan Note (Signed)
Wanted to change to Nexium. Has to call and reschedule her endoscopy and colonoscopy this is highly advised.

## 2021-11-14 LAB — HEPATITIS C ANTIBODY
Hepatitis C Ab: NONREACTIVE
SIGNAL TO CUT-OFF: 0.09 (ref <1.00)

## 2021-11-14 LAB — HGB A1C W/O EAG: Hgb A1c MFr Bld: 7.3 % — ABNORMAL HIGH (ref 4.8–5.6)

## 2021-11-14 NOTE — Telephone Encounter (Signed)
PA submitted. Awaiting approval or denial.  

## 2021-11-14 NOTE — Progress Notes (Signed)
Hepatitis C antibody non reactive/ negative. Hemoglobin slightly improved 7.3 now. CBC, cholesterol, and TSH for thyroid.CMP glucose elavated at 111.  Diabetes followed by endocrinology.

## 2021-11-29 ENCOUNTER — Encounter: Payer: Self-pay | Admitting: Adult Health

## 2021-11-29 DIAGNOSIS — K219 Gastro-esophageal reflux disease without esophagitis: Secondary | ICD-10-CM

## 2021-12-03 ENCOUNTER — Other Ambulatory Visit: Payer: Self-pay | Admitting: Surgery

## 2021-12-03 DIAGNOSIS — S83232S Complex tear of medial meniscus, current injury, left knee, sequela: Secondary | ICD-10-CM

## 2021-12-04 ENCOUNTER — Ambulatory Visit (INDEPENDENT_AMBULATORY_CARE_PROVIDER_SITE_OTHER): Payer: BC Managed Care – PPO

## 2021-12-04 ENCOUNTER — Other Ambulatory Visit: Payer: Self-pay

## 2021-12-04 DIAGNOSIS — Z23 Encounter for immunization: Secondary | ICD-10-CM | POA: Diagnosis not present

## 2021-12-04 NOTE — Progress Notes (Signed)
Pt presented today for a Tdap vaccine. Left deltoid, IM. Pt voiced no  concerns nor showed any signs of distress during injection.

## 2021-12-06 ENCOUNTER — Ambulatory Visit
Admission: RE | Admit: 2021-12-06 | Discharge: 2021-12-06 | Disposition: A | Discharge status: Home / Self Care | Payer: BC Managed Care – PPO | Source: Ambulatory Visit | Attending: Surgery | Admitting: Surgery

## 2021-12-06 DIAGNOSIS — S83232S Complex tear of medial meniscus, current injury, left knee, sequela: Secondary | ICD-10-CM

## 2021-12-06 DIAGNOSIS — S83272A Complex tear of lateral meniscus, current injury, left knee, initial encounter: Secondary | ICD-10-CM | POA: Diagnosis not present

## 2021-12-06 DIAGNOSIS — M1712 Unilateral primary osteoarthritis, left knee: Secondary | ICD-10-CM | POA: Diagnosis not present

## 2021-12-06 DIAGNOSIS — M25462 Effusion, left knee: Secondary | ICD-10-CM | POA: Diagnosis not present

## 2021-12-06 DIAGNOSIS — S83232A Complex tear of medial meniscus, current injury, left knee, initial encounter: Secondary | ICD-10-CM | POA: Diagnosis not present

## 2021-12-10 MED ORDER — ESOMEPRAZOLE MAGNESIUM 20 MG PO PACK: 40.0000 mg | PACK | Freq: Every day | ORAL | 1 refills | Status: DC

## 2021-12-31 DIAGNOSIS — S83231D Complex tear of medial meniscus, current injury, right knee, subsequent encounter: Secondary | ICD-10-CM | POA: Diagnosis not present

## 2021-12-31 DIAGNOSIS — M170 Bilateral primary osteoarthritis of knee: Secondary | ICD-10-CM | POA: Diagnosis not present

## 2021-12-31 DIAGNOSIS — S83272D Complex tear of lateral meniscus, current injury, left knee, subsequent encounter: Secondary | ICD-10-CM | POA: Diagnosis not present

## 2022-01-01 ENCOUNTER — Encounter: Payer: Self-pay | Admitting: Adult Health

## 2022-01-08 ENCOUNTER — Telehealth: Payer: BC Managed Care – PPO | Admitting: Adult Health

## 2022-01-08 ENCOUNTER — Encounter: Payer: Self-pay | Admitting: Adult Health

## 2022-01-08 VITALS — BP 188/98

## 2022-01-08 DIAGNOSIS — B370 Candidal stomatitis: Secondary | ICD-10-CM | POA: Diagnosis not present

## 2022-01-08 DIAGNOSIS — J029 Acute pharyngitis, unspecified: Secondary | ICD-10-CM | POA: Insufficient documentation

## 2022-01-08 MED ORDER — PREDNISONE 10 MG (21) PO TBPK: ORAL_TABLET | ORAL | 0 refills | Status: DC

## 2022-01-08 MED ORDER — NYSTATIN 100000 UNIT/ML MT SUSP: 5.0000 mL | Freq: Four times a day (QID) | OROMUCOSAL | 0 refills | Status: DC

## 2022-01-08 MED ORDER — CLINDAMYCIN HCL 300 MG PO CAPS: 300.0000 mg | ORAL_CAPSULE | Freq: Three times a day (TID) | ORAL | 0 refills | Status: DC

## 2022-01-08 NOTE — Progress Notes (Signed)
Virtual Visit via Video Note ? ?I connected with Kristin Cherry on 01/09/22 at  3:00 PM EST by a video enabled telemedicine application and verified that I am speaking with the correct person using two identifiers. ? ?Location: ?Patient: AT HOME   ?Provider: Provider: Provider's office at  [facility], [place]. ?  ? ?  ?I discussed the limitations of evaluation and management by telemedicine and the availability of in person appointments. The patient expressed understanding and agreed to proceed. ? ?History of Present Illness: ?Patient presents for video visit, she saw MD [facility] and he gave her azithromycin, and she has had no improvement with z pack for 3 days and no improvement.  ? ?Sore throat, exudate. Denies any cold symptoms or other symptoms. Low grade fever. Denies any swallowing. Denies any cough, chest congestion, sinus congestion, sinus pressure, body ached or headache.  ? ?Doing horehound candy and warm liquids. Temperature maximum 99.  ?Onset of symptoms- last week she had a scratchy throat over one week of symptoms. Has white spots in back of throat.  ?Denies any other exposures.  ? ? ?Patient  denies any fever, body aches,chills, rash, chest pain, shortness of breath, nausea, vomiting, or diarrhea.  ?Denies dizziness, lightheadedness, pre syncopal or syncopal episodes.  ? ?  ?Observations/Objective: ? ? ?Patient is alert and oriented and responsive to questions Engages in conversation with provider. Speaks in full sentences without any pauses without any shortness of breath or distress.   ? ?Assessment and Plan: ? ?1. Pharyngitis, unspecified etiology ?Penicillin allergy. ?- clindamycin (CLEOCIN) 300 MG capsule; Take 1 capsule (300 mg total) by mouth 3 (three) times daily.  Dispense: 30 capsule; Refill: 0 ?- predniSONE (STERAPRED UNI-PAK 21 TAB) 10 MG (21) TBPK tablet; PO: Take 6 tablets on day 1:Take 5 tablets day 2:Take 4 tablets day 3: Take 3 tablets day 4:Take 2  tablets day five: 5 Take 1 tablet day 6  Dispense: 21 tablet; Refill: 0 ? ?2. Oral thrush ? ?Nystatin mouth wash to cover thrush given difficult to examine via video.  ? ?Meds ordered this encounter  ?Medications  ? clindamycin (CLEOCIN) 300 MG capsule  ?  Sig: Take 1 capsule (300 mg total) by mouth 3 (three) times daily.  ?  Dispense:  30 capsule  ?  Refill:  0  ? nystatin (MYCOSTATIN) 100000 UNIT/ML suspension  ?  Sig: Take 5 mLs (500,000 Units total) by mouth 4 (four) times daily. Gargle and hold in mouth as long as you can and then spit out.  ?  Dispense:  60 mL  ?  Refill:  0  ? predniSONE (STERAPRED UNI-PAK 21 TAB) 10 MG (21) TBPK tablet  ?  Sig: PO: Take 6 tablets on day 1:Take 5 tablets day 2:Take 4 tablets day 3: Take 3 tablets day 4:Take 2 tablets day five: 5 Take 1 tablet day 6  ?  Dispense:  21 tablet  ?  Refill:  0  ?  ?Follow Up Instructions: ? ?Advised in person evaluation at anytime is advised if any symptoms do not improve, worsen or change at any given time.  ?Red Flags discussed. The patient was given clear instructions to go to ER or return to medical center if any red flags develop, symptoms do not improve, worsen or new problems develop. They verbalized understanding.  ?  ?I discussed the assessment and treatment plan with the patient. The patient was provided an opportunity to ask questions and all were  answered. The patient agreed with the plan and demonstrated an understanding of the instructions. ?  ?The patient was advised to call back or seek an in-person evaluation if the symptoms worsen or if the condition fails to improve as anticipated. ? ? ?Return in about 1 week (around 01/15/2022), or if symptoms worsen or fail to improve, for at any time for any worsening symptoms, Go to Emergency room/ urgent care if worse.  ? ? ?Patient is aware that his primary care provider is leaving the office and last day will be January 24, 2022 and he will need to establish care with a new primary care  provider, list is available upfront for patient to help him with establishing care or they can choose a provider of their choice.Patient verbalized understanding of all instructions given and denies any further questions at this time.   ? ?Marcille Buffy, FNP  ?

## 2022-01-09 ENCOUNTER — Encounter: Payer: Self-pay | Admitting: Adult Health

## 2022-01-09 NOTE — Patient Instructions (Signed)
Prednisolone Tablets What is this medication? PREDNISOLONE (pred NISS [place] lone) treats many conditions such as asthma, allergic reactions, arthritis, inflammatory bowel diseases, adrenal, and blood or bone marrow disorders. It works by decreasing inflammation, slowing down an overactive immune system, or replacing cortisol normally made in the body. Cortisol is a hormone that plays an important role in how the body responds to stress, illness, and injury. It belongs to a group of medications called steroids. This medicine may be used for other purposes; ask your health care provider or pharmacist if you have questions. COMMON BRAND NAME(S): Millipred, Millipred DP, Millipred DP 12-Day, Millipred DP 6 Day, Prednoral What should I tell my care team before I take this medication? They need to know if you have any of these conditions: Cushing's syndrome Diabetes Glaucoma Heart problems or disease High blood pressure Infection such as herpes, measles, tuberculosis, or chickenpox Kidney disease Liver disease Mental problems Myasthenia gravis Osteoporosis Seizures Stomach ulcer or intestine disease including colitis and diverticulitis Thyroid problem An unusual or allergic reaction to lactose, prednisolone, other medications, foods, dyes, or preservatives Pregnant or trying to get pregnant Breast-feeding How should I use this medication? Take this medication by mouth with a glass of water. Follow the directions on the prescription label. Take it with food or milk to avoid stomach upset. If you are taking this medication once a day, take it in the morning. Do not take more medication than you are told to take. Do not suddenly stop taking your medication because you may develop a severe reaction. Your care team will tell you how much medication to take. If your care team wants you to stop the medication, the dose may be slowly lowered over time to avoid any side effects. Talk to your care team about  the use of this medication in children. Special care may be needed. Overdosage: If you think you have taken too much of this medicine contact a poison control center or emergency room at once. NOTE: This medicine is only for you. Do not share this medicine with others. What if I miss a dose? If you miss a dose, take it as soon as you can. If it is almost time for your next dose, take only that dose. Do not take double or extra doses. What may interact with this medication? Do not take this medication with any of the following: Metyrapone Mifepristone This medication may also interact with the following: Aminoglutethimide Amphotericin B Aspirin and aspirin-like medications Barbiturates Certain medications for diabetes, like glipizide or glyburide Cholestyramine Cholinesterase inhibitors Cyclosporine Digoxin Diuretics Ephedrine Female hormones, like estrogens and birth control pills Isoniazid Ketoconazole NSAIDS, medications for pain and inflammation, like ibuprofen or naproxen Phenytoin Rifampin Toxoids Vaccines Warfarin This list may not describe all possible interactions. Give your health care provider a list of all the medicines, herbs, non-prescription drugs, or dietary supplements you use. Also tell them if you smoke, drink alcohol, or use illegal drugs. Some items may interact with your medicine. What should I watch for while using this medication? Visit your care team for regular checks on your progress. If you are taking this medication over a prolonged period, carry an identification card with your name and address, the type and dose of your medication, and your care team's name and address. This medication may increase your risk of getting an infection. Tell your care team if you are around anyone with measles or chickenpox, or if you develop sores or blisters that do not heal properly.  If you are going to have surgery, tell your care team that you have taken this  medication within the last twelve months. Ask your care team about your diet. You may need to lower the amount of salt you eat. This medication may increase blood sugar. Ask your care team if changes in diet or medications are needed if you have diabetes. What side effects may I notice from receiving this medication? Side effects that you should report to your care team as soon as possible: Allergic reactions--skin rash, itching, hives, swelling of the face, lips, tongue, or throat Cushing syndrome--increased fat around the midsection, upper back, neck, or face, pink or purple stretch marks on the skin, thinning, fragile skin that easily bruises, unexpected hair growth High blood sugar (hyperglycemia)--increased thirst or amount of urine, unusual weakness or fatigue, blurry vision Increase in blood pressure Infection--fever, chills, cough, sore throat, wounds that don't heal, pain or trouble when passing urine, general feeling of discomfort or being unwell Low adrenal gland function--nausea, vomiting, loss of appetite, unusual weakness or fatigue, dizziness Mood and behavior changes--anxiety, nervousness, confusion, hallucinations, irritability, hostility, thoughts of suicide or self-harm, worsening mood, feelings of depression Stomach bleeding--bloody or black, tar-like stools, vomiting blood or brown material that looks like coffee grounds Swelling of the ankles, hands, or feet Side effects that usually do not require medical attention (report to your care team if they continue or are bothersome): Acne General discomfort and fatigue Headache Increase in appetite Nausea Trouble sleeping Weight gain This list may not describe all possible side effects. Call your doctor for medical advice about side effects. You may report side effects to FDA at 1-800-FDA-1088. Where should I keep my medication? Keep out of the reach of children. Store at room temperature between 15 and 30 degrees C (59 and  86 degrees F). Keep container tightly closed. Throw away any unused medication after the expiration date. NOTE: This sheet is a summary. It may not cover all possible information. If you have questions about this medicine, talk to your doctor, pharmacist, or health care provider.  2022 Elsevier/Gold Standard (2021-01-19 00:00:00) Nystatin Suspension What is this medication? NYSTATIN (nye STAT in) treats thrush, a fungal or yeast infection in the mouth. It belongs to a group of medications called antifungals. It will not treat infections caused by bacteria or viruses. This medicine may be used for other purposes; ask your health care provider or pharmacist if you have questions. COMMON BRAND NAME(S): Mycostatin, Nystex What should I tell my care team before I take this medication? They need to know if you have any of these conditions: Diabetes Kidney disease An unusual or allergic reaction to nystatin, ethylenediamine, parabens, thimerosal, other foods, dyes or preservatives Pregnant or trying to get pregnant Breast-feeding How should I use this medication? Follow the directions on the prescription label. Shake well before using. Use a specially marked dropper to measure every dose. Ask your pharmacist if you do not have one. Put one half of the dose in each side of your mouth. Swish the medication around in your mouth and gargle. Hold your dose in your mouth for as long as you can. Swallow or spit out as directed by your care team. Take your medication at regular intervals. Do not take your medication more often than directed. Do not skip doses or stop your medication early even if you feel better. Do not stop taking except on your care team's advice. Talk to your care team about the use of this  medication in children. Special care may be needed. Overdosage: If you think you have taken too much of this medicine contact a poison control center or emergency room at once. NOTE: This medicine is only  for you. Do not share this medicine with others. What if I miss a dose? If you miss a dose, take it as soon as you can. If it is almost time for your next dose, take only that dose. Do not take double or extra doses. What may interact with this medication? Interactions are not expected. This list may not describe all possible interactions. Give your health care provider a list of all the medicines, herbs, non-prescription drugs, or dietary supplements you use. Also tell them if you smoke, drink alcohol, or use illegal drugs. Some items may interact with your medicine. What should I watch for while using this medication? Tell your care team if your symptoms do not improve or get worse. If you wear dentures talk to your care team about how to clean them. What side effects may I notice from receiving this medication? Side effects that you should report to your care team as soon as possible: Allergic reactions--skin rash, itching, hives, swelling of the face, lips, tongue, or throat Redness, blistering, peeling, or loosening of the skin, including inside the mouth Side effects that usually do not require medical attention (report to your care team if they continue or are bothersome): Irritation inside the mouth Upset stomach This list may not describe all possible side effects. Call your doctor for medical advice about side effects. You may report side effects to FDA at 1-800-FDA-1088. Where should I keep my medication? Keep out of the reach of children. Store at room temperature between 15 and 25 degrees C (59 and 77 degrees F). Protect from light. Throw away any unused medication after the expiration date. NOTE: This sheet is a summary. It may not cover all possible information. If you have questions about this medicine, talk to your doctor, pharmacist, or health care provider.  2022 Elsevier/Gold Standard (2021-01-26 00:00:00) Clindamycin Capsules What is this medication? CLINDAMYCIN ([place] sin) treats infections caused by bacteria. It belongs to a group of medications called antibiotics. It will not treat colds, the flu, or infections caused by viruses. This medicine may be used for other purposes; ask your health care provider or pharmacist if you have questions. COMMON BRAND NAME(S): Cleocin What should I tell my care team before I take this medication? They need to know if you have any of these conditions: Kidney disease Liver disease Stomach problems like colitis An unusual or allergic reaction to clindamycin, lincomycin, or other medications, foods, dyes like tartrazine or preservatives Pregnant or trying to get pregnant Breast-feeding How should I use this medication? Take this medication by mouth with a full glass of water. Follow the directions on the prescription label. You can take this medication with food or on an empty stomach. If the medication upsets your stomach, take it with food. Take your medication at regular intervals. Do not take your medication more often than directed. Take all of your medication as directed even if you think you are better. Do not skip doses or stop your medication early. Talk to your care team about the use of this medication in children. Special care may be needed. Overdosage: If you think you have taken too much of this medicine contact a poison control center or emergency room at once. NOTE: This medicine is only for you. Do  not share this medicine with others. What if I miss a dose? If you miss a dose, take it as soon as you can. If it is almost time for your next dose, take only that dose. Do not take double or extra doses. What may interact with this medication? Birth control pills Medications that relax muscles for surgery Rifampin This list may not describe all possible interactions. Give your health care provider a list of all the medicines, herbs, non-prescription drugs, or dietary supplements you use. Also tell them if you  smoke, drink alcohol, or use illegal drugs. Some items may interact with your medicine. What should I watch for while using this medication? Tell your care team if your symptoms do not start to get better or if they get worse. This medication may cause serious skin reactions. They can happen weeks to months after starting the medication. Contact your care team right away if you notice fevers or flu-like symptoms with a rash. The rash may be red or purple and then turn into blisters or peeling of the skin. Or, you might notice a red rash with swelling of the face, lips or lymph nodes in your neck or under your arms. Do not treat diarrhea with over the counter products. Contact your care team if you have diarrhea that lasts more than 2 days or if it is severe and watery. What side effects may I notice from receiving this medication? Side effects that you should report to your care team as soon as possible: Allergic reactions--skin rash, itching, hives, swelling of the face, lips, tongue, or throat Kidney injury--decrease in the amount of urine, swelling of the ankles, hands, or feet Rash, fever, and swollen lymph nodes Redness, blistering, peeling, or loosening of the skin, including inside the mouth Severe diarrhea, fever Unusual vaginal discharge, itching, or odor Side effects that usually do not require medical attention (report to your care team if they continue or are bothersome): Diarrhea Metallic taste in mouth Nausea Stomach pain Vomiting This list may not describe all possible side effects. Call your doctor for medical advice about side effects. You may report side effects to FDA at 1-800-FDA-1088. Where should I keep my medication? Keep out of the reach of children. Store at room temperature between 20 and 25 degrees C (68 and 77 degrees F). Throw away any unused medication after the expiration date. NOTE: This sheet is a summary. It may not cover all possible information. If you have  questions about this medicine, talk to your doctor, pharmacist, or health care provider.  2022 Elsevier/Gold Standard (2020-12-29 00:00:00) Pharyngitis Pharyngitis is inflammation of the throat (pharynx). It is a very common cause of sore throat. Pharyngitis can be caused by a bacteria, but it is usually caused by a virus. Most cases of pharyngitis get better on their own without treatment. What are the causes? This condition may be caused by: Infection by viruses (viral). Viral pharyngitis spreads easily from person to person (is contagious) through coughing, sneezing, and sharing of personal items or utensils such as cups, forks, spoons, and toothbrushes. Infection by bacteria (bacterial). Bacterial pharyngitis may be spread by touching the nose or face after coming in contact with the bacteria, or through close contact, such as kissing. Allergies. Allergies can cause buildup of mucus in the throat (post-nasal drip), leading to inflammation and irritation. Allergies can also cause blocked nasal passages, forcing breathing through the mouth, which dries and irritates the throat. What increases the risk? You are more likely  to develop this condition if: You are 77-61 years old. You are exposed to crowded environments such as daycare, school, or dormitory living. You live in a cold climate. You have a weakened disease-fighting (immune) system. What are the signs or symptoms? Symptoms of this condition vary by the cause. Common symptoms of this condition include: Sore throat. Fatigue. Low-grade fever. Stuffy nose (nasal congestion) and cough. Headache. Other symptoms may include: Glands in the neck (lymph nodes) that are swollen. Skin rashes. Plaque-like film on the throat or tonsils. This is often a symptom of bacterial pharyngitis. Vomiting. Red, itchy eyes (conjunctivitis). Loss of appetite. Joint pain and muscle aches. Enlarged tonsils. How is this diagnosed? This condition may be  diagnosed based on your medical history and a physical exam. Your health care provider will ask you questions about your illness and your symptoms. A swab of your throat may be done to check for bacteria (rapid strep test). Other lab tests may also be done, depending on the suspected cause, but these are rare. How is this treated? Many times, treatment is not needed for this condition. Pharyngitis usually gets better in 3-4 days without treatment. Bacterial pharyngitis may be treated with antibiotic medicines. Follow these instructions at home: Medicines Take over-the-counter and prescription medicines only as told by your health care provider. If you were prescribed an antibiotic medicine, take it as told by your health care provider. Do not stop taking the antibiotic even if you start to feel better. Use throat sprays to soothe your throat as told by your health care provider. Children can get pharyngitis. Do not give your child aspirin because of the association with Reye's syndrome. Managing pain To help with pain, try: Sipping warm liquids, such as broth, herbal tea, or warm water. Eating or drinking cold or frozen liquids, such as frozen ice pops. Gargling with a mixture of salt and water 3-4 times a day or as needed. To make salt water, completely dissolve -1 tsp (3-6 g) of salt in 1 cup (237 mL) of warm water. Sucking on hard candy or throat lozenges. Putting a cool-mist humidifier in your bedroom at night to moisten the air. Sitting in the bathroom with the door closed for 5-10 minutes while you run hot water in the shower.  General instructions  Do not use any products that contain nicotine or tobacco. These products include cigarettes, chewing tobacco, and vaping devices, such as e-cigarettes. If you need help quitting, ask your health care provider. Rest as told by your health care provider. Drink enough fluid to keep your urine pale yellow. How is this prevented? To help  prevent becoming infected or spreading infection: Wash your hands often with soap and water for at least 20 seconds. If soap and water are not available, use hand sanitizer. Do not touch your eyes, nose, or mouth with unwashed hands, and wash hands after touching these areas. Do not share cups or eating utensils. Avoid close contact with people who are sick. Contact a health care provider if: You have large, tender lumps in your neck. You have a rash. You cough up green, yellow-brown, or bloody mucus. Get help right away if: Your neck becomes stiff. You drool or are unable to swallow liquids. You cannot drink or take medicines without vomiting. You have severe pain that does not go away, even after you take medicine. You have trouble breathing, and it is not caused by a stuffy nose. You have new pain and swelling in your joints such  as the knees, ankles, wrists, or elbows. These symptoms may represent a serious problem that is an emergency. Do not wait to see if the symptoms will go away. Get medical help right away. Call your local emergency services (911 in the [place] Do not drive yourself to the hospital. Summary Pharyngitis is redness, pain, and swelling (inflammation) of the throat (pharynx). While pharyngitis can be caused by a bacteria, the most common causes are viral. Most cases of pharyngitis get better on their own without treatment. Bacterial pharyngitis is treated with antibiotic medicines. This information is not intended to replace advice given to you by your health care provider. Make sure you discuss any questions you have with your health care provider. Document Revised: 01/17/2021 Document Reviewed: 01/17/2021 Elsevier Patient Education  2022 [place], Adult Oral thrush is an infection in your mouth and throat and on your tongue. It causes white patches to form in your mouth and on your tongue. Many cases of thrush are mild. But, sometimes, thrush can  be serious. People who have a weak body defense system (immune system) or other diseases can be affected more. What are the causes? This condition is caused by a type of fungus called yeast. The fungus is normally present in small amounts in the mouth and nose. If a person has a long-term illness or a weak body defense system, the fungus can grow and spread quickly. This causes thrush. What increases the risk? You are more likely to develop this condition if: You have a weak body defense system. You are an older adult. You have diabetes, cancer, or HIV. You have a dry mouth. You are pregnant or breastfeeding. You do not take good care of your teeth. This risk is greater for people who have false teeth (dentures). You use antibiotic or steroid medicines. What are the signs or symptoms? Symptoms of this condition include: A burning feeling in the mouth and throat. White patches that stick to the mouth and tongue. A bad taste in the mouth or trouble tasting foods. A feeling like you have cotton in your mouth. Pain when you eat and swallow. Not wanting to eat as much as usual. Cracking at the corners of the mouth. How is this treated? This condition is treated with medicines called antifungals. These medicines prevent a fungus from growing. The medicines are either put right on the area (topical) or swallowed (oral). Your doctor will also treat other problems that you may have, such as diabetes or HIV. Follow these instructions at home: Helping with pain and soreness To lessen your pain: Drink cold liquids, like water and iced tea. Eat frozen ice pops or frozen juices. Eat foods that are easy to swallow, like gelatin and ice cream. Drink from a straw if you have too much pain in your mouth.  General instructions Take or use over-the-counter and prescription medicines only as told by your doctor. Eat plain yogurt that has live cultures in it. Read the label to make sure that there are  live cultures in your yogurt. If you wear false teeth: Take them out before you go to bed. Brush them well. Soak them in a cleaner. Rinse your mouth with warm salt-water many times a day. To make the salt-water mixture, dissolve -1 teaspoon (3-6 g) of salt in 1 cup (237 mL) of warm water. Contact a doctor if: Your problems do not get better within 7 days of treatment. Your infection is spreading. This may show as white areas  on the skin outside of your mouth. You are nursing your baby and you have redness and pain in the nipples. Summary Oral thrush is an infection in your mouth and throat. It is caused by a fungus. You are more likely to get this condition if you have a weak body defense system. Diseases like diabetes, cancer, or HIV also add to your risk. This condition is treated with medicines called antifungals. Contact a doctor if you do not get better within 7 days of starting treatment. This information is not intended to replace advice given to you by your health care provider. Make sure you discuss any questions you have with your health care provider. Document Revised: 06/20/2021 Document Reviewed: 08/27/2019 Elsevier Patient Education  [place].

## 2022-01-29 DIAGNOSIS — M2550 Pain in unspecified joint: Secondary | ICD-10-CM | POA: Diagnosis not present

## 2022-02-12 ENCOUNTER — Ambulatory Visit: Payer: BC Managed Care – PPO | Admitting: Adult Health

## 2022-03-07 DIAGNOSIS — E1159 Type 2 diabetes mellitus with other circulatory complications: Secondary | ICD-10-CM | POA: Diagnosis not present

## 2022-03-07 DIAGNOSIS — E1142 Type 2 diabetes mellitus with diabetic polyneuropathy: Secondary | ICD-10-CM | POA: Diagnosis not present

## 2022-03-07 DIAGNOSIS — E1169 Type 2 diabetes mellitus with other specified complication: Secondary | ICD-10-CM | POA: Diagnosis not present

## 2022-03-07 DIAGNOSIS — Z720 Tobacco use: Secondary | ICD-10-CM | POA: Diagnosis not present

## 2022-04-04 ENCOUNTER — Encounter: Payer: Self-pay | Admitting: *Deleted

## 2022-04-05 ENCOUNTER — Encounter
Admission: RE | Disposition: A | Discharge status: Home / Self Care | Payer: Self-pay | Source: Home / Self Care | Attending: Gastroenterology

## 2022-04-05 ENCOUNTER — Ambulatory Visit
Admission: RE | Admit: 2022-04-05 | Discharge: 2022-04-05 | Disposition: A | Discharge status: Home / Self Care | Payer: BC Managed Care – PPO | Attending: Gastroenterology | Admitting: Gastroenterology

## 2022-04-05 ENCOUNTER — Ambulatory Visit: Payer: BC Managed Care – PPO | Admitting: Certified Registered"

## 2022-04-05 ENCOUNTER — Encounter: Payer: Self-pay | Admitting: *Deleted

## 2022-04-05 DIAGNOSIS — E669 Obesity, unspecified: Secondary | ICD-10-CM | POA: Diagnosis not present

## 2022-04-05 DIAGNOSIS — E119 Type 2 diabetes mellitus without complications: Secondary | ICD-10-CM | POA: Diagnosis not present

## 2022-04-05 DIAGNOSIS — Z86010 Personal history of colonic polyps: Secondary | ICD-10-CM | POA: Diagnosis not present

## 2022-04-05 DIAGNOSIS — Z7984 Long term (current) use of oral hypoglycemic drugs: Secondary | ICD-10-CM | POA: Insufficient documentation

## 2022-04-05 DIAGNOSIS — I10 Essential (primary) hypertension: Secondary | ICD-10-CM | POA: Insufficient documentation

## 2022-04-05 DIAGNOSIS — K5730 Diverticulosis of large intestine without perforation or abscess without bleeding: Secondary | ICD-10-CM | POA: Insufficient documentation

## 2022-04-05 DIAGNOSIS — E785 Hyperlipidemia, unspecified: Secondary | ICD-10-CM | POA: Insufficient documentation

## 2022-04-05 DIAGNOSIS — Z1211 Encounter for screening for malignant neoplasm of colon: Secondary | ICD-10-CM | POA: Diagnosis not present

## 2022-04-05 DIAGNOSIS — D124 Benign neoplasm of descending colon: Secondary | ICD-10-CM | POA: Diagnosis not present

## 2022-04-05 DIAGNOSIS — K640 First degree hemorrhoids: Secondary | ICD-10-CM | POA: Diagnosis not present

## 2022-04-05 DIAGNOSIS — K219 Gastro-esophageal reflux disease without esophagitis: Secondary | ICD-10-CM | POA: Insufficient documentation

## 2022-04-05 DIAGNOSIS — K635 Polyp of colon: Secondary | ICD-10-CM | POA: Diagnosis not present

## 2022-04-05 HISTORY — PX: COLONOSCOPY: SHX5424

## 2022-04-05 HISTORY — PX: ESOPHAGOGASTRODUODENOSCOPY: SHX5428

## 2022-04-05 LAB — GLUCOSE, CAPILLARY: Glucose-Capillary: 148 mg/dL — ABNORMAL HIGH (ref 70–99)

## 2022-04-05 SURGERY — COLONOSCOPY
Anesthesia: General

## 2022-04-05 MED ORDER — LIDOCAINE HCL (PF) 2 % IJ SOLN
INTRAMUSCULAR | Status: AC
  Filled 2022-04-05: qty 5

## 2022-04-05 MED ORDER — LIDOCAINE HCL (CARDIAC) PF 100 MG/5ML IV SOSY
PREFILLED_SYRINGE | INTRAVENOUS | Status: DC | PRN
  Administered 2022-04-05: 100 mg via INTRAVENOUS

## 2022-04-05 MED ORDER — SODIUM CHLORIDE 0.9 % IV SOLN: INTRAVENOUS | Status: DC

## 2022-04-05 MED ORDER — PROPOFOL 10 MG/ML IV BOLUS
INTRAVENOUS | Status: DC | PRN
Start: 2022-04-05 — End: 2022-04-05
  Administered 2022-04-05: 120 mg via INTRAVENOUS
  Administered 2022-04-05: 120 ug/kg/min via INTRAVENOUS

## 2022-04-05 MED ORDER — PROPOFOL 10 MG/ML IV BOLUS
INTRAVENOUS | Status: AC
  Filled 2022-04-05: qty 40

## 2022-04-05 NOTE — Op Note (Signed)
[facility] Gastroenterology Patient Name: Kristin Cherry Procedure Date: 04/05/2022 7:21 AM MRN: [id number] Account #: [id number] Date of Birth: April 22, 1966 Admit Type: Outpatient Age: 56 Room: [facility] ENDO ROOM 3 Gender: Female Note Status: Finalized Instrument Name: Jasper Riling [id number] Procedure:             Colonoscopy Indications:           Surveillance: Personal history of adenomatous polyps                         on last colonoscopy > 3 years ago Providers:             Andrey Farmer MD, MD Referring MD:          no pcp Medicines:             Monitored Anesthesia Care Complications:         No immediate complications. Estimated blood loss:                         Minimal. Procedure:             Pre-Anesthesia Assessment:                        - Prior to the procedure, a History and Physical was                         performed, and patient medications and allergies were                         reviewed. The patient is competent. The risks and                         benefits of the procedure and the sedation options and                         risks were discussed with the patient. All questions                         were answered and informed consent was obtained.                         Patient identification and proposed procedure were                         verified by the physician, the nurse, the                         anesthesiologist, the anesthetist and the technician                         in the endoscopy suite. Mental Status Examination:                         alert and oriented. Airway Examination: normal                         oropharyngeal airway and neck mobility. Respiratory  Examination: clear to auscultation. CV Examination:                         normal. Prophylactic Antibiotics: The patient does not                         require prophylactic antibiotics. Prior                         Anticoagulants: The  patient has taken no previous                         anticoagulant or antiplatelet agents. ASA Grade                         Assessment: II - A patient with mild systemic disease.                         After reviewing the risks and benefits, the patient                         was deemed in satisfactory condition to undergo the                         procedure. The anesthesia plan was to use monitored                         anesthesia care (MAC). Immediately prior to                         administration of medications, the patient was                         re-assessed for adequacy to receive sedatives. The                         heart rate, respiratory rate, oxygen saturations,                         blood pressure, adequacy of pulmonary ventilation, and                         response to care were monitored throughout the                         procedure. The physical status of the patient was                         re-assessed after the procedure.                        After obtaining informed consent, the colonoscope was                         passed under direct vision. Throughout the procedure,                         the patient's blood pressure, pulse, and oxygen  saturations were monitored continuously. The                         Colonoscope was introduced through the anus and                         advanced to the the cecum, identified by appendiceal                         orifice and ileocecal valve. The colonoscopy was                         performed without difficulty. The patient tolerated                         the procedure well. The quality of the bowel                         preparation was good. Findings:      The perianal and digital rectal examinations were normal.      A single small-mouthed diverticulum was found in the ascending colon.      A 3 mm polyp was found in the descending colon. The polyp was sessile.       The  polyp was removed with a cold snare. Resection and retrieval were       complete. Estimated blood loss was minimal.      A 2 mm polyp was found in the recto-sigmoid colon. The polyp was       sessile. The polyp was removed with a cold snare. Resection and       retrieval were complete. Estimated blood loss was minimal.      Internal hemorrhoids were found during retroflexion. The hemorrhoids       were Grade I (internal hemorrhoids that do not prolapse).      The exam was otherwise without abnormality on direct and retroflexion       views. Impression:            - Diverticulosis in the ascending colon.                        - One 3 mm polyp in the descending colon, removed with                         a cold snare. Resected and retrieved.                        - One 2 mm polyp at the recto-sigmoid colon, removed                         with a cold snare. Resected and retrieved.                        - Internal hemorrhoids.                        - The examination was otherwise normal on direct and                         retroflexion views. Recommendation:        -  Discharge patient to home.                        - Resume previous diet.                        - Continue present medications.                        - Await pathology results.                        - Repeat colonoscopy in 5 years for surveillance.                        - Return to referring physician as previously                         scheduled. Procedure Code(s):     --- Professional ---                        [id number], Colonoscopy, flexible; with removal of                         tumor(s), polyp(s), or other lesion(s) by snare                         technique Diagnosis Code(s):     --- Professional ---                        Z86.010, Personal history of colonic polyps                        K64.0, First degree hemorrhoids                        K63.5, Polyp of colon                        K57.30, Diverticulosis  of large intestine without                         perforation or abscess without bleeding CPT copyright 2019 American Medical Association. All rights reserved. The codes documented in this report are preliminary and upon coder review may  be revised to meet current compliance requirements. Andrey Farmer MD, MD 04/05/2022 8:31:35 AM Number of Addenda: 0 Note Initiated On: 04/05/2022 7:21 AM Scope Withdrawal Time: 0 hours 11 minutes 3 seconds  Total Procedure Duration: 0 hours 15 minutes 42 seconds  Estimated Blood Loss:  Estimated blood loss was minimal.      [facility]

## 2022-04-05 NOTE — Transfer of Care (Signed)
Immediate Anesthesia Transfer of Care Note  Patient: Kristin Cherry  Procedure(s) Performed: COLONOSCOPY ESOPHAGOGASTRODUODENOSCOPY (EGD)  Patient Location: PACU  Anesthesia Type:General  Level of Consciousness: awake  Airway & Oxygen Therapy: Patient Spontanous Breathing  Post-op Assessment: Report given to RN and Post -op Vital signs reviewed and stable  Post vital signs: Reviewed and stable  Last Vitals:  Vitals Value Taken Time  BP 134/80 04/05/22 0824  Temp 36.1 C 04/05/22 0824  Pulse 64 0824  Resp 15 04/05/22 0824  SpO2 97 0824    Last Pain:  Vitals:   04/05/22 0824  TempSrc: Temporal  PainSc:          Complications: No notable events documented.

## 2022-04-05 NOTE — Anesthesia Preprocedure Evaluation (Addendum)
Anesthesia Evaluation  Patient identified by MRN, date of birth, ID band Patient awake    Reviewed: Allergy & Precautions, NPO status , Patient's Chart, lab work & pertinent test results  Airway Mallampati: III  TM Distance: >3 FB Neck ROM: full    Dental  (+) Chipped, Poor Dentition, Missing,    Pulmonary asthma , Current SmokerPatient did not abstain from smoking.,    Pulmonary exam normal        Cardiovascular Exercise Tolerance: Good hypertension, Normal cardiovascular exam     Neuro/Psych negative neurological ROS  negative psych ROS   GI/Hepatic Neg liver ROS, GERD  Controlled,  Endo/Other  diabetes, Well Controlled, Type 2, Oral Hypoglycemic Agents  Renal/GU negative Renal ROS  negative genitourinary   Musculoskeletal  (+) Arthritis ,   Abdominal (+) + obese,   Peds  Hematology negative hematology ROS (+)   Anesthesia Other Findings Past Medical History: No date: Anemia No date: Asthma No date: Diabetes mellitus without complication (HCC) No date: History of bronchitis No date: Hyperlipidemia No date: Hypertension No date: Obesity  Past Surgical History: 11/01/2020: BREAST BIOPSY; Right     Comment:  U/S bx, ribbon marker, path pending No date: CHOLECYSTECTOMY 12/26/2017: COLONOSCOPY WITH PROPOFOL; N/A     Comment:  Procedure: COLONOSCOPY WITH PROPOFOL;  Surgeon: Manya Silvas, MD;  Location: [facility] ENDOSCOPY;  Service:               Endoscopy;  Laterality: N/A; No date: KNEE SURGERY     Comment:  slow the growth of right knee patient reports in 1979               and left knee cartilage repair in 2000 No date: SALPINGOOPHORECTOMY; Right     Reproductive/Obstetrics negative OB ROS                            Anesthesia Physical Anesthesia Plan  ASA: 2  Anesthesia Plan: General   Post-op Pain Management: Minimal or no pain anticipated   Induction:  Intravenous  PONV Risk Score and Plan: Propofol infusion and TIVA  Airway Management Planned: Natural Airway and Nasal Cannula  Additional Equipment:   Intra-op Plan:   Post-operative Plan:   Informed Consent: I have reviewed the patients History and Physical, chart, labs and discussed the procedure including the risks, benefits and alternatives for the proposed anesthesia with the patient or authorized representative who has indicated his/her understanding and acceptance.     Dental Advisory Given  Plan Discussed with: Anesthesiologist, CRNA and Surgeon  Anesthesia Plan Comments:        Anesthesia Quick Evaluation

## 2022-04-05 NOTE — Interval H&P Note (Signed)
History and Physical Interval Note:  04/05/2022 7:53 AM  Kristin Cherry  has presented today for surgery, with the diagnosis of Gastroesophageal reflux disease, unspecified whether esophagitis present (K21.9) History of colon polyps (Z86.010).  The various methods of treatment have been discussed with the patient and family. After consideration of risks, benefits and other options for treatment, the patient has consented to  Procedure(s) with comments: COLONOSCOPY (N/A) - DM ESOPHAGOGASTRODUODENOSCOPY (EGD) (N/A) as a surgical intervention.  The patient's history has been reviewed, patient examined, no change in status, stable for surgery.  I have reviewed the patient's chart and labs.  Questions were answered to the patient's satisfaction.     Lesly Rubenstein  Ok to proceed with EGD/Colonoscopy

## 2022-04-05 NOTE — Op Note (Signed)
[facility] Gastroenterology Patient Name: Kristin Cherry Procedure Date: 04/05/2022 7:22 AM MRN: [id number] Account #: [id number] Date of Birth: 1966/01/24 Admit Type: Outpatient Age: 56 Room: [facility] ENDO ROOM 3 Gender: Female Note Status: Finalized Instrument Name: Altamese Cabal Endoscope [id number] Procedure:             Upper GI endoscopy Indications:           Gastro-esophageal reflux disease Providers:             Andrey Farmer MD, MD Referring MD:          no pcp Medicines:             Monitored Anesthesia Care Complications:         No immediate complications. Procedure:             Pre-Anesthesia Assessment:                        - Prior to the procedure, a History and Physical was                         performed, and patient medications and allergies were                         reviewed. The patient is competent. The risks and                         benefits of the procedure and the sedation options and                         risks were discussed with the patient. All questions                         were answered and informed consent was obtained.                         Patient identification and proposed procedure were                         verified by the physician, the nurse, the                         anesthesiologist, the anesthetist and the technician                         in the endoscopy suite. Mental Status Examination:                         alert and oriented. Airway Examination: normal                         oropharyngeal airway and neck mobility. Respiratory                         Examination: clear to auscultation. CV Examination:                         normal. Prophylactic Antibiotics: The patient does not  require prophylactic antibiotics. Prior                         Anticoagulants: The patient has taken no previous                         anticoagulant or antiplatelet agents. ASA Grade                          Assessment: II - A patient with mild systemic disease.                         After reviewing the risks and benefits, the patient                         was deemed in satisfactory condition to undergo the                         procedure. The anesthesia plan was to use monitored                         anesthesia care (MAC). Immediately prior to                         administration of medications, the patient was                         re-assessed for adequacy to receive sedatives. The                         heart rate, respiratory rate, oxygen saturations,                         blood pressure, adequacy of pulmonary ventilation, and                         response to care were monitored throughout the                         procedure. The physical status of the patient was                         re-assessed after the procedure.                        After obtaining informed consent, the endoscope was                         passed under direct vision. Throughout the procedure,                         the patient's blood pressure, pulse, and oxygen                         saturations were monitored continuously. The Endoscope                         was introduced through the mouth, and advanced to the  second part of duodenum. The upper GI endoscopy was                         accomplished without difficulty. The patient tolerated                         the procedure well. Findings:      The examined esophagus was normal.      The entire examined stomach was normal.      The examined duodenum was normal. Impression:            - Normal esophagus.                        - Normal stomach.                        - Normal examined duodenum.                        - No specimens collected. Recommendation:        - Perform a colonoscopy today. Procedure Code(s):     --- Professional ---                        [phone], Esophagogastroduodenoscopy, flexible,                          transoral; diagnostic, including collection of                         specimen(s) by brushing or washing, when performed                         (separate procedure) Diagnosis Code(s):     --- Professional ---                        K21.9, Gastro-esophageal reflux disease without                         esophagitis CPT copyright 2019 American Medical Association. All rights reserved. The codes documented in this report are preliminary and upon coder review may  be revised to meet current compliance requirements. Andrey Farmer MD, MD 04/05/2022 8:28:32 AM Number of Addenda: 0 Note Initiated On: 04/05/2022 7:22 AM Estimated Blood Loss:  Estimated blood loss: none.      [facility]

## 2022-04-05 NOTE — Anesthesia Postprocedure Evaluation (Signed)
Anesthesia Post Note  Patient: Kristin Cherry  Procedure(s) Performed: COLONOSCOPY ESOPHAGOGASTRODUODENOSCOPY (EGD)  Patient location during evaluation: Endoscopy Anesthesia Type: General Level of consciousness: awake and alert Pain management: pain level controlled Vital Signs Assessment: post-procedure vital signs reviewed and stable Respiratory status: spontaneous breathing, nonlabored ventilation and respiratory function stable Cardiovascular status: blood pressure returned to baseline and stable Postop Assessment: no apparent nausea or vomiting Anesthetic complications: no   No notable events documented.   Last Vitals:  Vitals:   04/05/22 0834 04/05/22 0844  BP: 129/82 (!) 148/84  Pulse: 70 65  Resp: 15 13  Temp:    SpO2: 99% 99%    Last Pain:  Vitals:   04/05/22 0844  TempSrc:   PainSc: 0-No pain                 [place]

## 2022-04-05 NOTE — H&P (Signed)
Outpatient short stay form Pre-procedure 04/05/2022  Lesly Rubenstein, MD  Primary Physician: Pcp, No  Reason for visit:  GERD/Surveillance colonoscopy  History of present illness:    56 y/o lady with history of obesity, DM II, and GERD here for EGD/Colonoscopy for GERD and history of TVA on last colonoscopy in 2019. No blood thinners. No family history of GI malignancies. No significant history of abdominal surgeries.    Current Facility-Administered Medications:    0.9 %  sodium chloride infusion, , Intravenous, Continuous, Dyanna Seiter, Hilton Cork, MD  Medications Prior to Admission  Medication Sig Dispense Refill Last Dose   buPROPion (WELLBUTRIN XL) 300 MG 24 hr tablet Take 1 tablet (300 mg total) by mouth daily. 90 tablet 0 04/04/2022   esomeprazole (NEXIUM) 20 MG packet Take 40 mg by mouth daily before breakfast. 60 each 1 04/04/2022   FARXIGA 10 MG TABS tablet Take 10 mg by mouth daily.   04/04/2022   gabapentin (NEURONTIN) 300 MG capsule Take 300 mg by mouth 3 (three) times daily.   04/04/2022   glimepiride (AMARYL) 2 MG tablet Take 1 tablet by mouth daily with breakfast.   04/04/2022   rosuvastatin (CRESTOR) 5 MG tablet Take 1 tablet (5 mg total) by mouth daily. 90 tablet 3 04/04/2022   albuterol (VENTOLIN HFA) 108 (90 Base) MCG/ACT inhaler Inhale 2 puffs into the lungs every 6 (six) hours as needed for wheezing or shortness of breath. 8 g 2    clindamycin (CLEOCIN) 300 MG capsule Take 1 capsule (300 mg total) by mouth 3 (three) times daily. 30 capsule 0    cyclobenzaprine (FLEXERIL) 10 MG tablet Take 1 tablet (10 mg total) by mouth 3 (three) times daily as needed for muscle spasms (will cause drowsiness.). 30 tablet 0    nystatin (MYCOSTATIN) 100000 UNIT/ML suspension Take 5 mLs (500,000 Units total) by mouth 4 (four) times daily. Gargle and hold in mouth as long as you can and then spit out. 60 mL 0    OZEMPIC, 2 MG/DOSE, 8 MG/3ML SOPN Inject into the skin.      predniSONE (STERAPRED UNI-PAK  21 TAB) 10 MG (21) TBPK tablet PO: Take 6 tablets on day 1:Take 5 tablets day 2:Take 4 tablets day 3: Take 3 tablets day 4:Take 2 tablets day five: 5 Take 1 tablet day 6 21 tablet 0      Allergies  Allergen Reactions   Penicillins Hives and Other (See Comments)   Lodine [Etodolac] Hives     Past Medical History:  Diagnosis Date   Anemia    Asthma    Diabetes mellitus without complication ([place])    History of bronchitis    Hyperlipidemia    Hypertension    Obesity     Review of systems:  Otherwise negative.    Physical Exam  Gen: Alert, oriented. Appears stated age.  HEENT: PERRLA. Lungs: No respiratory distress CV: RRR Abd: soft, benign, no masses Ext: No edema    Planned procedures: Proceed with EGD/colonoscopy. The patient understands the nature of the planned procedure, indications, risks, alternatives and potential complications including but not limited to bleeding, infection, perforation, damage to internal organs and possible oversedation/side effects from anesthesia. The patient agrees and gives consent to proceed.  Please refer to procedure notes for findings, recommendations and patient disposition/instructions.     Lesly Rubenstein, MD [facility] Gastroenterology

## 2022-04-06 ENCOUNTER — Encounter: Payer: Self-pay | Admitting: Gastroenterology

## 2022-04-08 LAB — SURGICAL PATHOLOGY

## 2022-06-24 DIAGNOSIS — M1711 Unilateral primary osteoarthritis, right knee: Secondary | ICD-10-CM | POA: Diagnosis not present

## 2022-07-01 DIAGNOSIS — M170 Bilateral primary osteoarthritis of knee: Secondary | ICD-10-CM | POA: Diagnosis not present

## 2022-07-05 DIAGNOSIS — E785 Hyperlipidemia, unspecified: Secondary | ICD-10-CM | POA: Diagnosis not present

## 2022-07-05 DIAGNOSIS — E1169 Type 2 diabetes mellitus with other specified complication: Secondary | ICD-10-CM | POA: Diagnosis not present

## 2022-07-05 DIAGNOSIS — I152 Hypertension secondary to endocrine disorders: Secondary | ICD-10-CM | POA: Diagnosis not present

## 2022-07-05 DIAGNOSIS — E1142 Type 2 diabetes mellitus with diabetic polyneuropathy: Secondary | ICD-10-CM | POA: Diagnosis not present

## 2022-07-05 DIAGNOSIS — E1159 Type 2 diabetes mellitus with other circulatory complications: Secondary | ICD-10-CM | POA: Diagnosis not present

## 2022-07-09 DIAGNOSIS — M170 Bilateral primary osteoarthritis of knee: Secondary | ICD-10-CM | POA: Diagnosis not present

## 2022-07-09 DIAGNOSIS — E1159 Type 2 diabetes mellitus with other circulatory complications: Secondary | ICD-10-CM | POA: Diagnosis not present

## 2022-07-09 DIAGNOSIS — E1169 Type 2 diabetes mellitus with other specified complication: Secondary | ICD-10-CM | POA: Diagnosis not present

## 2022-07-09 DIAGNOSIS — E1142 Type 2 diabetes mellitus with diabetic polyneuropathy: Secondary | ICD-10-CM | POA: Diagnosis not present

## 2022-07-09 DIAGNOSIS — Z720 Tobacco use: Secondary | ICD-10-CM | POA: Diagnosis not present

## 2022-09-02 ENCOUNTER — Encounter (INDEPENDENT_AMBULATORY_CARE_PROVIDER_SITE_OTHER): Payer: Self-pay

## 2022-09-06 DIAGNOSIS — M170 Bilateral primary osteoarthritis of knee: Secondary | ICD-10-CM | POA: Diagnosis not present

## 2022-09-06 DIAGNOSIS — S83231D Complex tear of medial meniscus, current injury, right knee, subsequent encounter: Secondary | ICD-10-CM | POA: Diagnosis not present

## 2022-09-06 DIAGNOSIS — S83272D Complex tear of lateral meniscus, current injury, left knee, subsequent encounter: Secondary | ICD-10-CM | POA: Diagnosis not present

## 2022-09-18 ENCOUNTER — Other Ambulatory Visit: Payer: Self-pay | Admitting: Surgery

## 2022-09-19 ENCOUNTER — Encounter
Admission: RE | Admit: 2022-09-19 | Discharge: 2022-09-19 | Disposition: A | Discharge status: Home / Self Care | Payer: BC Managed Care – PPO | Source: Ambulatory Visit | Attending: Surgery | Admitting: Surgery

## 2022-09-19 VITALS — Ht 66.0 in | Wt 229.5 lb

## 2022-09-19 DIAGNOSIS — D649 Anemia, unspecified: Secondary | ICD-10-CM

## 2022-09-19 DIAGNOSIS — Z01818 Encounter for other preprocedural examination: Secondary | ICD-10-CM

## 2022-09-19 DIAGNOSIS — E119 Type 2 diabetes mellitus without complications: Secondary | ICD-10-CM

## 2022-09-19 DIAGNOSIS — I10 Essential (primary) hypertension: Secondary | ICD-10-CM

## 2022-09-19 DIAGNOSIS — Z01812 Encounter for preprocedural laboratory examination: Secondary | ICD-10-CM

## 2022-09-19 HISTORY — DX: Gastro-esophageal reflux disease without esophagitis: K21.9

## 2022-09-19 NOTE — Patient Instructions (Signed)
Your procedure is scheduled on:10-02-22 Wednesday Report to the Registration Desk on the 1st floor of the [facility].Then proceed to the 2nd floor Surgery Desk To find out your arrival time, please call [phone] between 1PM - 3PM on:10-01-22 Tuesday If your arrival time is 6:00 am, do not arrive prior to that time as the [place] entrance doors do not open until 6:00 am.  REMEMBER: Instructions that are not followed completely may result in serious medical risk, up to and including death; or upon the discretion of your surgeon and anesthesiologist your surgery may need to be rescheduled.  Do not eat food after midnight the night before surgery.  No gum chewing, lozengers or hard candies.  You may however, drink Water up to 2 hours before you are scheduled to arrive for your surgery. Do not drink anything within 2 hours of your scheduled arrival time.  Type 1 and Type 2 diabetics should only drink water.  In addition, your doctor has ordered for you to drink the provided  Gatorade G2 Drinking this carbohydrate drink up to two hours before surgery helps to reduce insulin resistance and improve patient outcomes. Please complete drinking 2 hours prior to scheduled arrival time.  TAKE THESE MEDICATIONS THE MORNING OF SURGERY WITH A SIP OF WATER: -buPROPion (WELLBUTRIN XL)  -rosuvastatin (CRESTOR)  -esomeprazole (NEXIUM)-take one the night before and one on the morning of surgery - helps to prevent nausea after surgery.)  Use your Albuterol Inhaler the morning of surgery and bring your Albuterol Inhaler to the hospital  Stop your Farxiga 3 days prior to surgery-Last dose will be on 09-28-22 Saturday  Stop your tirzepatide [facility]) 7 days prior to surgery-Last dose will be on 09-21-22 Saturday  One week prior to surgery: Stop Anti-inflammatories (NSAIDS) such as Advil, Aleve, Ibuprofen, Motrin, Naproxen, Naprosyn and Aspirin based products such as Excedrin, Goodys Powder, BC  Powder.You may however, take Tylenol if needed for pain up until the day of surgery.  Stop ANY OVER THE COUNTER supplements/vitamins 7 days prior to surgery  No Alcohol for 24 hours before or after surgery.  No Smoking including e-cigarettes for 24 hours prior to surgery.  No chewable tobacco products for at least 6 hours prior to surgery.  No nicotine patches on the day of surgery.  Do not use any "recreational" drugs for at least a week prior to your surgery.  Please be advised that the combination of cocaine and anesthesia may have negative outcomes, up to and including death. If you test positive for cocaine, your surgery will be cancelled.  On the morning of surgery brush your teeth with toothpaste and water, you may rinse your mouth with mouthwash if you wish. Do not swallow any toothpaste or mouthwash.  Use CHG Soap as directed on instruction sheet.  Do not wear jewelry, make-up, hairpins, clips or nail polish.  Do not wear lotions, powders, or perfumes.   Do not shave body from the neck down 48 hours prior to surgery just in case you cut yourself which could leave a site for infection.  Also, freshly shaved skin may become irritated if using the CHG soap.  Contact lenses, hearing aids and dentures may not be worn into surgery.  Do not bring valuables to the hospital. [facility] is not responsible for any missing/lost belongings or valuables.   Notify your doctor if there is any change in your medical condition (cold, fever, infection).  Wear comfortable clothing (specific to your surgery type) to  the hospital.  After surgery, you can help prevent lung complications by doing breathing exercises.  Take deep breaths and cough every 1-2 hours. Your doctor may order a device called an Incentive Spirometer to help you take deep breaths. When coughing or sneezing, hold a pillow firmly against your incision with both hands. This is called "splinting." Doing this helps protect  your incision. It also decreases belly discomfort.  If you are being admitted to the hospital overnight, leave your suitcase in the car. After surgery it may be brought to your room.  If you are being discharged the day of surgery, you will not be allowed to drive home. You will need a responsible adult (18 years or older) to drive you home and stay with you that night.   If you are taking public transportation, you will need to have a responsible adult (18 years or older) with you. Please confirm with your physician that it is acceptable to use public transportation.   Please call the [place] Dept. at [phone] if you have any questions about these instructions.  Surgery Visitation Policy:  Patients undergoing a surgery or procedure may have two family members or support persons with them as long as the person is not COVID-19 positive or experiencing its symptoms.   MASKING: Due to an increase in RSV rates and hospitalizations, starting Wednesday, Nov. 15, in patient care areas in which we serve newborns, infants and children, masks will be required for teammates and visitors.  Children ages 27 and under may not visit. This policy affects the following departments only:  [facility] area Mother Baby Unit Newborn nursery/Special care nursery  Other areas: Masks continue to be strongly recommended for [facility] teammates, visitors and patients in all other areas. Visitation is not restricted outside of the units listed above.   How to Use an Incentive Spirometer An incentive spirometer is a tool that measures how well you are filling your lungs with each breath. Learning to take long, deep breaths using this tool can help you keep your lungs clear and active. This may help to reverse or lessen your chance of developing breathing (pulmonary) problems, especially infection. You may be asked to use a spirometer: After a  surgery. If you have a lung problem or a history of smoking. After a long period of time when you have been unable to move or be active. If the spirometer includes an indicator to show the highest number that you have reached, your health care provider or respiratory therapist will help you set a goal. Keep a log of your progress as told by your health care provider. What are the risks? Breathing too quickly may cause dizziness or cause you to pass out. Take your time so you do not get dizzy or light-headed. If you are in pain, you may need to take pain medicine before doing incentive spirometry. It is harder to take a deep breath if you are having pain. How to use your incentive spirometer  Sit up on the edge of your bed or on a chair. Hold the incentive spirometer so that it is in an upright position. Before you use the spirometer, breathe out normally. Place the mouthpiece in your mouth. Make sure your lips are closed tightly around it. Breathe in slowly and as deeply as you can through your mouth, causing the piston or the ball to rise toward the top of the chamber. Hold your breath for 3-5 seconds, or  for as long as possible. If the spirometer includes a coach indicator, use this to guide you in breathing. Slow down your breathing if the indicator goes above the marked areas. Remove the mouthpiece from your mouth and breathe out normally. The piston or ball will return to the bottom of the chamber. Rest for a few seconds, then repeat the steps 10 or more times. Take your time and take a few normal breaths between deep breaths so that you do not get dizzy or light-headed. Do this every 1-2 hours when you are awake. If the spirometer includes a goal marker to show the highest number you have reached (best effort), use this as a goal to work toward during each repetition. After each set of 10 deep breaths, cough a few times. This will help to make sure that your lungs are clear. If you have  an incision on your chest or abdomen from surgery, place a pillow or a rolled-up towel firmly against the incision when you cough. This can help to reduce pain while taking deep breaths and coughing. General tips When you are able to get out of bed: Walk around often. Continue to take deep breaths and cough in order to clear your lungs. Keep using the incentive spirometer until your health care provider says it is okay to stop using it. If you have been in the hospital, you may be told to keep using the spirometer at home. Contact a health care provider if: You are having difficulty using the spirometer. You have trouble using the spirometer as often as instructed. Your pain medicine is not giving enough relief for you to use the spirometer as told. You have a fever. Get help right away if: You develop shortness of breath. You develop a cough with bloody mucus from the lungs. You have fluid or blood coming from an incision site after you cough. Summary An incentive spirometer is a tool that can help you learn to take long, deep breaths to keep your lungs clear and active. You may be asked to use a spirometer after a surgery, if you have a lung problem or a history of smoking, or if you have been inactive for a long period of time. Use your incentive spirometer as instructed every 1-2 hours while you are awake. If you have an incision on your chest or abdomen, place a pillow or a rolled-up towel firmly against your incision when you cough. This will help to reduce pain. Get help right away if you have shortness of breath, you cough up bloody mucus, or blood comes from your incision when you cough. This information is not intended to replace advice given to you by your health care provider. Make sure you discuss any questions you have with your health care provider. Document Revised: 01/10/2020 Document Reviewed: 01/10/2020 Elsevier Patient Education  [place].

## 2022-09-20 ENCOUNTER — Encounter
Admission: RE | Admit: 2022-09-20 | Discharge: 2022-09-20 | Disposition: A | Discharge status: Home / Self Care | Payer: BC Managed Care – PPO | Source: Ambulatory Visit | Attending: Surgery | Admitting: Surgery

## 2022-09-20 DIAGNOSIS — E119 Type 2 diabetes mellitus without complications: Secondary | ICD-10-CM

## 2022-09-20 DIAGNOSIS — Z01818 Encounter for other preprocedural examination: Secondary | ICD-10-CM | POA: Diagnosis not present

## 2022-09-20 DIAGNOSIS — Z01812 Encounter for preprocedural laboratory examination: Secondary | ICD-10-CM

## 2022-09-20 DIAGNOSIS — D649 Anemia, unspecified: Secondary | ICD-10-CM | POA: Diagnosis not present

## 2022-09-20 DIAGNOSIS — I10 Essential (primary) hypertension: Secondary | ICD-10-CM | POA: Insufficient documentation

## 2022-09-20 DIAGNOSIS — Z01810 Encounter for preprocedural cardiovascular examination: Secondary | ICD-10-CM | POA: Diagnosis not present

## 2022-09-20 LAB — CBC
HCT: 43.5 % (ref 36.0–46.0)
Hemoglobin: 14.7 g/dL (ref 12.0–15.0)
MCH: 29.7 pg (ref 26.0–34.0)
MCHC: 33.8 g/dL (ref 30.0–36.0)
MCV: 87.9 fL (ref 80.0–100.0)
Platelets: 238 10*3/uL (ref 150–400)
RBC: 4.95 MIL/uL (ref 3.87–5.11)
RDW: 12.5 % (ref 11.5–15.5)
WBC: 11 10*3/uL — ABNORMAL HIGH (ref 4.0–10.5)
nRBC: 0 % (ref 0.0–0.2)

## 2022-09-24 NOTE — Progress Notes (Signed)
  Perioperative Services Pre-Admission/Anesthesia Testing    Date: 09/24/22  Name: Kristin Cherry MRN:   [id number]  Re: GLP-1 clearance and provider recommendations   Planned Surgical Procedure(s):    Case: [id number] Date/Time: 10/02/22 1338   Procedure: RIGHT KNEE ARTHROSCOPY WITH DEBRIDEMENT, PARTIAL MEDIAL AND LATERAL MENISCECTOMIES, AND REINJECTION OF FAT CELLS. (Right: Knee)   Anesthesia type: Choice   Pre-op diagnosis:      Primary osteoarthritis of right knee M17.11     Complex tear of medial meniscus of right knee as current injury, subsequent encounter S83.231D   Location: [facility]   Surgeons: Corky Mull, MD      Clinical Notes:  Patient is scheduled for the above procedure with the indicated provider/surgeon. In review of her medication reconciliation it was noted that patient is on a prescribed GLP-1 medication. Per guidelines issued by the American Society of Anesthesiologists (ASA), it is recommended that these medications be held for 7 days prior to the patient undergoing any type of elective surgical procedure. The patient is taking the following GLP-1 medication:  '[]'$  SEMAGLUTIDE   '[]'$  EXENATIDE  '[]'$  LIRAGLUTIDE   '[]'$  LIXISENATIDE  '[]'$  DULAGLUTIDE     '[x]'$  OTHER GLP-1/GIP medication: tirzepatide  Reached out to prescribing provider Kristin Junes, MD) to make them aware of the guidelines from anesthesia. Given that this patient takes the prescribed GLP-1 medication for her  diabetes diagnosis, rather than for weight loss, recommendations from the prescribing provider were solicited. Prescribing provider made aware of the following so that informed decision/POC can be developed for this patient that may be taking medications belonging to these drug classes:  Oral GLP-1 medications will be held 1 day prior to surgery.  Injectable GLP-1 medications will be held 7 days prior to surgery.  Metformin is routinely held 48 hours prior to  surgery due to renal concerns, potential need for contrasted imaging perioperatively, and the potential for tissue hypoxia leading to drug induced lactic acidosis.  All SGLT2i medications are held 72 hours prior to surgery as they can be associated with the increased potential for developing euglycemic diabetic ketoacidosis (EDKA).   Impression and Plan:  Kristin Cherry is on a prescribed GLP-1 medication, which induces the known side effect of decreased gastric emptying. Efforts are bring made to mitigate the risk of perioperative hyperglycemic events, as elevated blood glucose levels have been found to contribute to intra/postoperative complications. Additionally, hyperglycemic extremes can potentially necessitate the postponing of a patient's elective case in order to better optimize perioperative glycemic control, again with the aforementioned guidelines in place. With this in mind, recommendations have been sought from the prescribing provider, who has cleared patient to proceed with holding the prescribed GLP-1/GIP (tirzepatide) as per the guidelines from the ASA.   Provider recommending: no further recommendations received from the prescribing provider.  Copy of signed clearance and recommendations placed on patient's chart for inclusion in their medical record and for review by the surgical/anesthetic team on the day of her procedure.   Kristin Loh, MSN, APRN, FNP-C, CEN [facility]  Peri-operative Services Nurse Practitioner Phone: [phone] 09/24/22 9:41 AM  NOTE: This note has been prepared using Dragon dictation software. Despite my best ability to proofread, there is always the potential that unintentional transcriptional errors may still occur from this process.

## 2022-09-24 NOTE — Progress Notes (Signed)
  Perioperative Services: Pre-Admission/Anesthesia Testing  Abnormal Lab Notification    Date: 09/24/22  Name: Kristin Cherry MRN:   [id number]  Re: Abnormal labs noted during PAT appointment   Provider(s) Notified: Poggi, Marshall Cork, MD Notification mode: Routed and/or faxed via [place] LAB VALUE(S): Lab Results  Component Value Date   GLUCOSE 111 (H) 11/13/2021    Notes: Patient with a T2DM diagnosis. She is currently on both oral (dapaglifloxin, glimepiride) and parenteral (tirzepatide) therapies. Last Hgb A1c was 9.9% on 07/05/2022. In efforts to reduce risk of developing SSI/PJI, or other potential perioperative complications, this communication is being sent in order to determine if patient is deemed to have adequate medical optimization, including preoperative glycemic control.   The odds ratio for SSI/PJI infection is between 2.8 and 3.4 for orthopedic surgery patients with pre-operative serum glucose levels of > 125 mg/dL or a post-operative levels of > 200 mg/dL ([place], 2019).    Data suggests that a Hgb A1c threshold of 7.7% tends to be more indicative of infection than the commonly used 7% and should perhaps be the pre-operative patient optimization goal Carolin Guernsey et al., 2017).   With that being said, the benefit of improving glycemic control must be weighed against the overall risk associated with delaying a necessary elective orthopedic surgery for this patient.   This is a Community education officer; no formal response is required.  Citations: Charlett Blake, A.F. Reducing the risk of infection after total joint arthroplasty: preoperative optimization. Arthroplasty 1, 4 (2019). [email or web address]  Lorrin Goodell MM, [place], Brigati D, [place] SM, [address] [place] JC, [place], [place], [place], Parvizi Lenna Sciara [place]. Determining the Threshold for HbA1c as a Predictor for Adverse Outcomes After Total Joint  Arthroplasty: A Multicenter, Retrospective Study. J Arthroplasty. 2017 Sep;32(9S):S263-S267.e1. [email or web address].   Honor Loh, MSN, APRN, FNP-C, CEN [facility]  Peri-operative Services Nurse Practitioner Phone: [phone] 09/24/22 9:45 AM

## 2022-10-02 ENCOUNTER — Encounter: Payer: Self-pay | Admitting: Surgery

## 2022-10-02 ENCOUNTER — Other Ambulatory Visit: Payer: Self-pay

## 2022-10-02 ENCOUNTER — Ambulatory Visit
Admission: RE | Admit: 2022-10-02 | Discharge: 2022-10-02 | Disposition: A | Discharge status: Home / Self Care | Payer: BC Managed Care – PPO | Attending: Surgery | Admitting: Surgery

## 2022-10-02 ENCOUNTER — Encounter
Admission: RE | Disposition: A | Discharge status: Home / Self Care | Payer: Self-pay | Source: Home / Self Care | Attending: Surgery

## 2022-10-02 ENCOUNTER — Ambulatory Visit: Payer: BC Managed Care – PPO | Admitting: Urgent Care

## 2022-10-02 DIAGNOSIS — Z8249 Family history of ischemic heart disease and other diseases of the circulatory system: Secondary | ICD-10-CM | POA: Diagnosis not present

## 2022-10-02 DIAGNOSIS — M170 Bilateral primary osteoarthritis of knee: Secondary | ICD-10-CM | POA: Insufficient documentation

## 2022-10-02 DIAGNOSIS — Z7984 Long term (current) use of oral hypoglycemic drugs: Secondary | ICD-10-CM | POA: Diagnosis not present

## 2022-10-02 DIAGNOSIS — K219 Gastro-esophageal reflux disease without esophagitis: Secondary | ICD-10-CM | POA: Insufficient documentation

## 2022-10-02 DIAGNOSIS — M23221 Derangement of posterior horn of medial meniscus due to old tear or injury, right knee: Secondary | ICD-10-CM | POA: Diagnosis not present

## 2022-10-02 DIAGNOSIS — Z79899 Other long term (current) drug therapy: Secondary | ICD-10-CM | POA: Diagnosis not present

## 2022-10-02 DIAGNOSIS — M94261 Chondromalacia, right knee: Secondary | ICD-10-CM | POA: Diagnosis not present

## 2022-10-02 DIAGNOSIS — M23203 Derangement of unspecified medial meniscus due to old tear or injury, right knee: Secondary | ICD-10-CM | POA: Diagnosis not present

## 2022-10-02 DIAGNOSIS — M1711 Unilateral primary osteoarthritis, right knee: Secondary | ICD-10-CM | POA: Diagnosis not present

## 2022-10-02 DIAGNOSIS — Z833 Family history of diabetes mellitus: Secondary | ICD-10-CM | POA: Diagnosis not present

## 2022-10-02 DIAGNOSIS — I10 Essential (primary) hypertension: Secondary | ICD-10-CM | POA: Insufficient documentation

## 2022-10-02 DIAGNOSIS — Z7985 Long-term (current) use of injectable non-insulin antidiabetic drugs: Secondary | ICD-10-CM | POA: Insufficient documentation

## 2022-10-02 DIAGNOSIS — M23200 Derangement of unspecified lateral meniscus due to old tear or injury, right knee: Secondary | ICD-10-CM | POA: Diagnosis not present

## 2022-10-02 DIAGNOSIS — S83231A Complex tear of medial meniscus, current injury, right knee, initial encounter: Secondary | ICD-10-CM | POA: Diagnosis not present

## 2022-10-02 DIAGNOSIS — E1165 Type 2 diabetes mellitus with hyperglycemia: Secondary | ICD-10-CM | POA: Diagnosis not present

## 2022-10-02 DIAGNOSIS — E119 Type 2 diabetes mellitus without complications: Secondary | ICD-10-CM

## 2022-10-02 DIAGNOSIS — Z01818 Encounter for other preprocedural examination: Secondary | ICD-10-CM

## 2022-10-02 DIAGNOSIS — F17210 Nicotine dependence, cigarettes, uncomplicated: Secondary | ICD-10-CM | POA: Diagnosis not present

## 2022-10-02 HISTORY — PX: KNEE ARTHROSCOPY: SHX127

## 2022-10-02 LAB — GLUCOSE, CAPILLARY
Glucose-Capillary: 191 mg/dL — ABNORMAL HIGH (ref 70–99)
Glucose-Capillary: 162 mg/dL — ABNORMAL HIGH (ref 70–99)

## 2022-10-02 SURGERY — ARTHROSCOPY, KNEE
Anesthesia: General | Site: Knee | Laterality: Right

## 2022-10-02 MED ORDER — SODIUM CHLORIDE 0.9 % IV SOLN: INTRAVENOUS | Status: DC

## 2022-10-02 MED ORDER — MIDAZOLAM HCL 2 MG/2ML IJ SOLN
INTRAMUSCULAR | Status: AC
  Filled 2022-10-02: qty 2

## 2022-10-02 MED ORDER — BUPROPION HCL ER (XL) 300 MG PO TB24: 300.0000 mg | ORAL_TABLET | ORAL | Status: DC

## 2022-10-02 MED ORDER — LIDOCAINE HCL (CARDIAC) PF 100 MG/5ML IV SOSY
PREFILLED_SYRINGE | INTRAVENOUS | Status: DC | PRN
  Administered 2022-10-02: 50 mg via INTRAVENOUS

## 2022-10-02 MED ORDER — EPHEDRINE SULFATE (PRESSORS) 50 MG/ML IJ SOLN
INTRAMUSCULAR | Status: DC | PRN
  Administered 2022-10-02: 5 mg via INTRAVENOUS

## 2022-10-02 MED ORDER — KETAMINE HCL 50 MG/5ML IJ SOSY
PREFILLED_SYRINGE | INTRAMUSCULAR | Status: AC
  Filled 2022-10-02: qty 5

## 2022-10-02 MED ORDER — RINGERS IRRIGATION IR SOLN
Status: DC | PRN
  Administered 2022-10-02: 3000 mL

## 2022-10-02 MED ORDER — LACTATED RINGERS IV SOLN: INTRAVENOUS | Status: DC | PRN

## 2022-10-02 MED ORDER — KETAMINE HCL 10 MG/ML IJ SOLN
INTRAMUSCULAR | Status: DC | PRN
  Administered 2022-10-02: 20 mg via INTRAVENOUS

## 2022-10-02 MED ORDER — CEFAZOLIN SODIUM-DEXTROSE 2-4 GM/100ML-% IV SOLN
INTRAVENOUS | Status: AC
  Filled 2022-10-02: qty 100

## 2022-10-02 MED ORDER — DEXAMETHASONE SODIUM PHOSPHATE 10 MG/ML IJ SOLN
INTRAMUSCULAR | Status: DC | PRN
  Administered 2022-10-02: 5 mg via INTRAVENOUS

## 2022-10-02 MED ORDER — ROCURONIUM BROMIDE 100 MG/10ML IV SOLN
INTRAVENOUS | Status: DC | PRN
  Administered 2022-10-02: 10 mg via INTRAVENOUS
  Administered 2022-10-02: 30 mg via INTRAVENOUS

## 2022-10-02 MED ORDER — BUPIVACAINE-EPINEPHRINE (PF) 0.5% -1:200000 IJ SOLN
INTRAMUSCULAR | Status: DC | PRN
  Administered 2022-10-02: 30 mL

## 2022-10-02 MED ORDER — CHLORHEXIDINE GLUCONATE 0.12 % MT SOLN
OROMUCOSAL | Status: AC
  Administered 2022-10-02: 15 mL via OROMUCOSAL
  Filled 2022-10-02: qty 15

## 2022-10-02 MED ORDER — BUPIVACAINE-EPINEPHRINE (PF) 0.5% -1:200000 IJ SOLN
INTRAMUSCULAR | Status: AC
  Filled 2022-10-02: qty 30

## 2022-10-02 MED ORDER — METOCLOPRAMIDE HCL 5 MG/ML IJ SOLN: 5.0000 mg | Freq: Three times a day (TID) | INTRAMUSCULAR | Status: DC | PRN

## 2022-10-02 MED ORDER — HYDROCODONE-ACETAMINOPHEN 5-325 MG PO TABS: 1.0000 | ORAL_TABLET | Freq: Four times a day (QID) | ORAL | 0 refills | Status: DC | PRN

## 2022-10-02 MED ORDER — LIDOCAINE HCL (PF) 1 % IJ SOLN
INTRAMUSCULAR | Status: AC
  Filled 2022-10-02: qty 30

## 2022-10-02 MED ORDER — LIDOCAINE HCL (PF) 1 % IJ SOLN
INTRAMUSCULAR | Status: DC | PRN
  Administered 2022-10-02: 60 mL

## 2022-10-02 MED ORDER — KETOROLAC TROMETHAMINE 30 MG/ML IJ SOLN
INTRAMUSCULAR | Status: AC
  Filled 2022-10-02: qty 1

## 2022-10-02 MED ORDER — ONDANSETRON HCL 4 MG/2ML IJ SOLN: 4.0000 mg | Freq: Four times a day (QID) | INTRAMUSCULAR | Status: DC | PRN

## 2022-10-02 MED ORDER — PROPOFOL 10 MG/ML IV BOLUS
INTRAVENOUS | Status: DC | PRN
  Administered 2022-10-02: 200 mg via INTRAVENOUS

## 2022-10-02 MED ORDER — SUGAMMADEX SODIUM 500 MG/5ML IV SOLN
INTRAVENOUS | Status: DC | PRN
  Administered 2022-10-02: 300 mg via INTRAVENOUS

## 2022-10-02 MED ORDER — PROPOFOL 1000 MG/100ML IV EMUL
INTRAVENOUS | Status: AC
  Filled 2022-10-02: qty 100

## 2022-10-02 MED ORDER — MIDAZOLAM HCL 2 MG/2ML IJ SOLN
INTRAMUSCULAR | Status: DC | PRN
  Administered 2022-10-02: 2 mg via INTRAVENOUS

## 2022-10-02 MED ORDER — SUCCINYLCHOLINE CHLORIDE 200 MG/10ML IV SOSY
PREFILLED_SYRINGE | INTRAVENOUS | Status: DC | PRN
  Administered 2022-10-02: 100 mg via INTRAVENOUS

## 2022-10-02 MED ORDER — LIDOCAINE-EPINEPHRINE 1 %-1:100000 IJ SOLN
INTRAMUSCULAR | Status: AC
  Filled 2022-10-02: qty 1

## 2022-10-02 MED ORDER — PHENYLEPHRINE HCL (PRESSORS) 10 MG/ML IV SOLN
INTRAVENOUS | Status: DC | PRN
  Administered 2022-10-02 (×2): 80 ug via INTRAVENOUS

## 2022-10-02 MED ORDER — ONDANSETRON HCL 4 MG PO TABS: 4.0000 mg | ORAL_TABLET | Freq: Four times a day (QID) | ORAL | Status: DC | PRN

## 2022-10-02 MED ORDER — FENTANYL CITRATE (PF) 100 MCG/2ML IJ SOLN
INTRAMUSCULAR | Status: AC
  Filled 2022-10-02: qty 2

## 2022-10-02 MED ORDER — CEFAZOLIN SODIUM-DEXTROSE 2-4 GM/100ML-% IV SOLN
2.0000 g | INTRAVENOUS | Status: AC
  Administered 2022-10-02: 2 g via INTRAVENOUS

## 2022-10-02 MED ORDER — ONDANSETRON HCL 4 MG/2ML IJ SOLN
INTRAMUSCULAR | Status: DC | PRN
  Administered 2022-10-02: 4 mg via INTRAVENOUS

## 2022-10-02 MED ORDER — FENTANYL CITRATE (PF) 100 MCG/2ML IJ SOLN
INTRAMUSCULAR | Status: DC | PRN
  Administered 2022-10-02 (×2): 50 ug via INTRAVENOUS

## 2022-10-02 MED ORDER — KETOROLAC TROMETHAMINE 30 MG/ML IJ SOLN
30.0000 mg | Freq: Once | INTRAMUSCULAR | Status: AC
  Administered 2022-10-02: 30 mg via INTRAVENOUS

## 2022-10-02 MED ORDER — CHLORHEXIDINE GLUCONATE 0.12 % MT SOLN: 15.0000 mL | Freq: Once | OROMUCOSAL | Status: AC

## 2022-10-02 MED ORDER — ORAL CARE MOUTH RINSE: 15.0000 mL | Freq: Once | OROMUCOSAL | Status: AC

## 2022-10-02 MED ORDER — HYDROCODONE-ACETAMINOPHEN 5-325 MG PO TABS: 1.0000 | ORAL_TABLET | ORAL | Status: DC | PRN

## 2022-10-02 MED ORDER — METOCLOPRAMIDE HCL 10 MG PO TABS: 5.0000 mg | ORAL_TABLET | Freq: Three times a day (TID) | ORAL | Status: DC | PRN

## 2022-10-02 SURGICAL SUPPLY — 45 items
APL PRP STRL LF DISP 70% ISPRP (MISCELLANEOUS) ×1
BAG COUNTER SPONGE SURGICOUNT (BAG) IMPLANT
BAG SPNG CNTER NS LX DISP (BAG)
BLADE FULL RADIUS 3.5 (BLADE) ×1 IMPLANT
BLADE SHAVER 4.5X7 STR FR (MISCELLANEOUS) ×1 IMPLANT
BNDG ELASTIC 6X5.8 VLCR STR LF (GAUZE/BANDAGES/DRESSINGS) ×1 IMPLANT
BNDG ESMARK 6X12 TAN STRL LF (GAUZE/BANDAGES/DRESSINGS) ×1 IMPLANT
CHLORAPREP W/TINT 26 (MISCELLANEOUS) ×1 IMPLANT
COLLECTOR GRAFT TISSUE (SYSTAGENIX WOUND MANAGEMENT) ×1
CUFF TOURN SGL QUICK 24 (TOURNIQUET CUFF)
CUFF TOURN SGL QUICK 34 (TOURNIQUET CUFF)
CUFF TRNQT CYL 24X4X16.5-23 (TOURNIQUET CUFF) IMPLANT
CUFF TRNQT CYL 34X4.125X (TOURNIQUET CUFF) IMPLANT
CUP MEDICINE 2OZ PLAST GRAD ST (MISCELLANEOUS) ×1 IMPLANT
DRAPE ARTHRO LIMB 89X125 STRL (DRAPES) ×1 IMPLANT
DRAPE IMP U-DRAPE 54X76 (DRAPES) ×1 IMPLANT
ELECT REM PT RETURN 9FT ADLT (ELECTROSURGICAL) ×1
ELECTRODE REM PT RTRN 9FT ADLT (ELECTROSURGICAL) ×1 IMPLANT
GAUZE SPONGE 4X4 12PLY STRL (GAUZE/BANDAGES/DRESSINGS) ×1 IMPLANT
GLOVE BIO SURGEON STRL SZ8 (GLOVE) ×2 IMPLANT
GLOVE BIOGEL M 7.0 STRL (GLOVE) ×2 IMPLANT
GLOVE SURG UNDER LTX SZ8 (GLOVE) ×1 IMPLANT
GLOVE SURG UNDER POLY LF SZ7.5 (GLOVE) ×1 IMPLANT
GOWN STRL REUS W/ TWL LRG LVL3 (GOWN DISPOSABLE) ×1 IMPLANT
GOWN STRL REUS W/ TWL XL LVL3 (GOWN DISPOSABLE) ×2 IMPLANT
GOWN STRL REUS W/TWL LRG LVL3 (GOWN DISPOSABLE) ×1
GOWN STRL REUS W/TWL XL LVL3 (GOWN DISPOSABLE) ×2
IV LACTATED RINGER IRRG 3000ML (IV SOLUTION) ×1
IV LR IRRIG 3000ML ARTHROMATIC (IV SOLUTION) ×1 IMPLANT
KIT TURNOVER KIT A (KITS) ×1 IMPLANT
MANIFOLD NEPTUNE II (INSTRUMENTS) ×2 IMPLANT
NDL HYPO 21X1.5 SAFETY (NEEDLE) ×1 IMPLANT
NEEDLE HYPO 21X1.5 SAFETY (NEEDLE) ×1 IMPLANT
PACK ARTHROSCOPY KNEE (MISCELLANEOUS) ×1 IMPLANT
SLEEVE REMOTE CONTROL 5X12 (DRAPES) IMPLANT
SPONGE T-LAP 18X18 ~~LOC~~+RFID (SPONGE) ×1 IMPLANT
SUT PROLENE 4 0 PS 2 18 (SUTURE) ×1 IMPLANT
SUT TICRON COATED BLUE 2 0 30 (SUTURE) IMPLANT
SYR 30ML LL (SYRINGE) ×1 IMPLANT
SYR 50ML LL SCALE MARK (SYRINGE) ×1 IMPLANT
TISSUE GRAFT COLLECTOR (SYSTAGENIX WOUND MANAGEMENT) IMPLANT
TRAP FLUID SMOKE EVACUATOR (MISCELLANEOUS) ×1 IMPLANT
TUBING INFLOW SET DBFLO PUMP (TUBING) ×1 IMPLANT
WAND WEREWOLF FLOW 90D (MISCELLANEOUS) ×1 IMPLANT
WATER STERILE IRR 500ML POUR (IV SOLUTION) ×1 IMPLANT

## 2022-10-02 NOTE — Anesthesia Preprocedure Evaluation (Addendum)
Anesthesia Evaluation  Patient identified by MRN, date of birth, ID band Patient awake    Reviewed: Allergy & Precautions, NPO status , Patient's Chart, lab work & pertinent test results  History of Anesthesia Complications Negative for: history of anesthetic complications  Airway Mallampati: III  TM Distance: >3 FB Neck ROM: full    Dental  (+) Upper Dentures   Pulmonary asthma , Current Smoker and Patient abstained from smoking.   Pulmonary exam normal        Cardiovascular hypertension, On Medications Normal cardiovascular exam     Neuro/Psych negative neurological ROS  negative psych ROS   GI/Hepatic Neg liver ROS,GERD  Medicated,,  Endo/Other  diabetes (Last dose of Mounjaro 11/18), Poorly Controlled, Type 2    Renal/GU      Musculoskeletal   Abdominal   Peds  Hematology negative hematology ROS (+)   Anesthesia Other Findings Past Medical History: No date: Anemia No date: Asthma     Comment:  well controlled No date: Diabetes mellitus without complication (HCC) No date: GERD (gastroesophageal reflux disease) No date: History of bronchitis No date: Hyperlipidemia No date: Hypertension No date: Obesity  Past Surgical History: 11/01/2020: BREAST BIOPSY; Right     Comment:  U/S bx, ribbon marker, path pending No date: CHOLECYSTECTOMY 04/05/2022: COLONOSCOPY; N/A     Comment:  Procedure: COLONOSCOPY;  Surgeon: Lesly Rubenstein,               MD;  Location: [facility] ENDOSCOPY;  Service: Endoscopy;                Laterality: N/A;  DM 12/26/2017: COLONOSCOPY WITH PROPOFOL; N/A     Comment:  Procedure: COLONOSCOPY WITH PROPOFOL;  Surgeon: Manya Silvas, MD;  Location: [facility] ENDOSCOPY;  Service:               Endoscopy;  Laterality: N/A; 04/05/2022: ESOPHAGOGASTRODUODENOSCOPY; N/A     Comment:  Procedure: ESOPHAGOGASTRODUODENOSCOPY (EGD);  Surgeon:               Lesly Rubenstein, MD;   Location: [facility] ENDOSCOPY;                Service: Endoscopy;  Laterality: N/A; 2000: KNEE CARTILAGE SURGERY; Left No date: KNEE SURGERY     Comment:  slow the growth of right knee patient reports in 1979               and left knee cartilage repair in 2000 No date: SALPINGOOPHORECTOMY; Right  BMI    Body Mass Index: 37.04 kg/m      Reproductive/Obstetrics negative OB ROS                             Anesthesia Physical Anesthesia Plan  ASA: 3  Anesthesia Plan: General   Post-op Pain Management: Toradol IV (intra-op)* and Ofirmev IV (intra-op)*   Induction: Intravenous  PONV Risk Score and Plan: 3 and Midazolam, Dexamethasone, Ondansetron and Treatment may vary due to age or medical condition  Airway Management Planned: Oral ETT  Additional Equipment:   Intra-op Plan:   Post-operative Plan: Extubation in OR  Informed Consent: I have reviewed the patients History and Physical, chart, labs and discussed the procedure including the risks, benefits and alternatives for the proposed anesthesia with the patient or authorized representative who has indicated his/her understanding and acceptance.  Dental Advisory Given  Plan Discussed with: Anesthesiologist, CRNA and Surgeon  Anesthesia Plan Comments: (Patient consented for risks of anesthesia including but not limited to:  - adverse reactions to medications - damage to eyes, teeth, lips or other oral mucosa - nerve damage due to positioning  - risk of bleeding, infection and or nerve damage from spinal that could lead to paralysis - risk of headache or failed spinal - damage to teeth, lips or other oral mucosa - sore throat or hoarseness - damage to heart, brain, nerves, lungs, other parts of body or loss of life  Patient voiced understanding.)        Anesthesia Quick Evaluation

## 2022-10-02 NOTE — Op Note (Addendum)
10/02/2022  8:55 AM  Patient:   Kristin Cherry  Pre-Op Diagnosis:   Degenerative joint disease with medial meniscus tear, right knee.  Postoperative diagnosis:   Degenerative joint disease with medial and lateral meniscal tears, right knee.  Procedure:   Extensive debridement with partial medial and lateral meniscectomies: abrasion chondroplasty of lateral tibial plateau, the central ridge of patella, and the medial femoral condyle; and re-injection of harvested fat cells, right knee.  Surgeon:   Pascal Lux, MD  Anesthesia:   GET  Findings:   As above. There was a complex tear of the posterior medial portion of the medial meniscus, as well as degenerative tearing of the central portion of the lateral meniscus. There was an area of grade III-IV chondromalacia involving the medial weightbearing portion of the lateral tibial plateau, as well as grade II chondromalacial changes involving the medial edge of the medial femoral condyle and central ridge of the patella. The anterior posterior crucial ligaments both were in satisfactory condition.  Complications:   None  EBL:   2 cc.  Total fluids:   600 cc of crystalloid.  Tourniquet time:   None  Drains:   None  Closure:   4-0 Prolene interrupted sutures.  Brief clinical note:   The patient is a 56 year old female with a long history of gradually worsening medial sided right knee pain. Her symptoms have progressed despite medications, activity modification, etc. Her history and examination are consistent with a medial meniscus tear with underlying degenerative joint disease of the right knee, all of which were confirmed by preoperative MRI scanning. The patient presents at this time for arthroscopy, debridement, and partial medial meniscectomy.  Procedure:   The patient was brought into the operating room and lain in the supine position. After adequate general endotracheal intubation and anesthesia was obtained, a timeout was  performed to verify the appropriate side. The patient's right knee was injected sterilely using a solution of 30 cc of 1% lidocaine and 30 cc of 0.5% Sensorcaine with epinephrine. The right lower extremity was prepped with ChloraPrep solution before being draped sterilely. Preoperative antibiotics were administered. The expected portal sites were injected with 0.5% Sensorcaine with epinephrine before the camera was placed in the anterolateral portal and instrumentation performed through the anteromedial portal.   The knee was sequentially examined beginning in the suprapatellar pouch, then progressing to the patellofemoral space, the medial gutter and compartment, the notch, and finally the lateral compartment and gutter. The findings were as described above. Abundant reactive synovial tissues in the anterior, medial, and lateral portions of the knee, as well as much of the infrapatellar fat pad anteriorly, were debrided and collected in an Arthrex GraftNet using the full-radius resector. The medial meniscus was carefully probed and demonstrated the above-noted tear. This area of unstable meniscus tearing was debrided back to a stable rim using a small meniscal basket and full-radius resector. Subsequent probing of the remaining rim demonstrated excellent stability. Laterally, the unstable tear involving the central portion of the lateral meniscus also was debrided back to stable margins using the full-radius resector. Again, subsequent probing demonstrated excellent stability of the remaining rim.  The area of grade III-IV chondromalacia with an unstable flap of chondral tissue involving the medial weightbearing portion of the lateral tibial plateau was debrided back to stable margins using the full-radius resector. Areas of grade II-III chondromalacial fibrillation involving the medial portion of the medial femoral condyle and central ridge of the patella also were debrided back to  stable margins using the  full-radius resector. The instruments were removed from the joint after suctioning the excess fluid.    At this point, the harvested fat cells were placed into a Toomey syringe and reinjected into the joint through a red rubber catheter inserted into the joint through a metal cannula.  The portal sites were closed using 4-0 Prolene interrupted sutures before a sterile bulky dressing was applied to the knee. The patient was then awakened, extubated, and returned to the recovery room in satisfactory condition after tolerating the procedure well.

## 2022-10-02 NOTE — Discharge Instructions (Addendum)
Orthopedic discharge instructions: Keep dressing dry and intact.  May shower after dressing changed on post-op day #4 (Sunday).  Cover sutures with Band-Aids after drying off. Apply ice frequently to knee. Take Aleve 2 tabs BID with meals for 5-7 days, then as necessary. Take pain medication as prescribed or ES Tylenol when needed.  May weight-bear as tolerated - use crutches or walker as needed. Follow-up in 10-14 days or as scheduled.AMBULATORY SURGERY  DISCHARGE INSTRUCTIONS   The drugs that you were given will stay in your system until tomorrow so for the next 24 hours you should not:  Drive an automobile Make any legal decisions Drink any alcoholic beverage   You may resume regular meals tomorrow.  Today it is better to start with liquids and gradually work up to solid foods.  You may eat anything you prefer, but it is better to start with liquids, then soup and crackers, and gradually work up to solid foods.   Please notify your doctor immediately if you have any unusual bleeding, trouble breathing, redness and pain at the surgery site, drainage, fever, or pain not relieved by medication.    Additional Instructions:        Please contact your physician with any problems or Same Day Surgery at [phone], Monday through Friday 6 am to 4 pm, or [employer] at [facility] number at [phone].

## 2022-10-02 NOTE — Transfer of Care (Signed)
Immediate Anesthesia Transfer of Care Note  Patient: Kristin Cherry  Procedure(s) Performed: RIGHT KNEE ARTHROSCOPY WITH DEBRIDEMENT, PARTIAL MEDIAL AND LATERAL MENISCECTOMIES, AND REINJECTION OF FAT CELLS. (Right: Knee)  Patient Location: PACU  Anesthesia Type:General  Level of Consciousness: drowsy  Airway & Oxygen Therapy: Patient Spontanous Breathing and Patient connected to face mask oxygen  Post-op Assessment: Report given to RN and Post -op Vital signs reviewed and stable  Post vital signs: Reviewed and stable  Last Vitals:  Vitals Value Taken Time  BP    Temp    Pulse    Resp    SpO2      Last Pain:  Vitals:   10/02/22 0620  TempSrc: Temporal  PainSc: 7          Complications: No notable events documented.

## 2022-10-02 NOTE — Addendum Note (Signed)
Addendum  created 10/02/22 1044 by Beverely Low, CRNA   Flowsheet accepted

## 2022-10-02 NOTE — Anesthesia Postprocedure Evaluation (Signed)
Anesthesia Post Note  Patient: Kristin Cherry  Procedure(s) Performed: RIGHT KNEE ARTHROSCOPY WITH DEBRIDEMENT, PARTIAL MEDIAL AND LATERAL MENISCECTOMIES, AND REINJECTION OF FAT CELLS. (Right: Knee)  Patient location during evaluation: PACU Anesthesia Type: General Level of consciousness: awake and alert Pain management: pain level controlled Vital Signs Assessment: post-procedure vital signs reviewed and stable Respiratory status: spontaneous breathing, nonlabored ventilation, respiratory function stable and patient connected to nasal cannula oxygen Cardiovascular status: blood pressure returned to baseline and stable Postop Assessment: no apparent nausea or vomiting Anesthetic complications: no   No notable events documented.   Last Vitals:  Vitals:   10/02/22 0840 10/02/22 0845  BP: 125/66 107/70  Pulse: 84 83  Resp: 10 12  Temp: (!) 36.2 C   SpO2: 95% 96%    Last Pain:  Vitals:   10/02/22 0840  TempSrc:   PainSc: 0-No pain                 Ilene Qua

## 2022-10-02 NOTE — H&P (Signed)
History of Present Illness:  Kristin Cherry is a 56 y.o. female who presents for follow-up of her bilateral knee pain secondary to early degenerative joint disease with complex meniscal tears of each knee. The patient was last seen by myself for these symptoms 8 months ago. At that time, the patient was given steroid injections into each knee which she states provided temporary partial relief of her symptoms. Her symptoms recurred several months later. After discussing additional treatment options by phone, she elected to undergo a series of viscosupplementation injections by Cameron Proud, PA-C, which were performed in August and early September. The patient notes that these helped for about 4 weeks before her symptoms again began to recur. Therefore, the patient was advised to return to the office for formal evaluation and to discuss additional treatment options. The patient denies any reinjury to either knee. On today's visit, she feels that her right knee symptoms are worse on her left. She we will get an occasional buckling sensation to her left knee but notes that her right knee is more constantly sore. She has been taking Aleve and applying diclofenac gel or ice with limited benefit. She has difficulty walking for any length of time and is unable to reciprocate stairs. She also will have pain at night. She is not ambulating with any assistive devices.  Current Outpatient Medications: albuterol 90 mcg/actuation inhaler Inhale 2 inhalations into the lungs every 6 (six) hours as needed for Wheezing  buPROPion (WELLBUTRIN XL) 300 MG XL tablet Take 300 mg by mouth once daily  cyclobenzaprine (FLEXERIL) 10 MG tablet Take 10 mg by mouth every 8 (eight) hours as needed  dapagliflozin propanediol (FARXIGA) 10 mg tablet Take 1 tablet (10 mg total) by mouth every morning 90 tablet 4  esomeprazole (NEXIUM) 20 mg packet Take 40 mg by mouth as needed  glimepiride (AMARYL) 2 MG tablet Take 1 tablet (2 mg total) by  mouth daily with breakfast 90 tablet 3  lisinopriL (ZESTRIL) 20 MG tablet Take 1 tablet (20 mg total) by mouth once daily 30 tablet 11  naproxen (NAPROSYN) 500 MG tablet Take 500 mg by mouth as needed  naproxen sodium (ALEVE) 220 MG tablet Take 220 mg by mouth as needed for Pain  rosuvastatin (CRESTOR) 10 MG tablet Take 1 tablet (10 mg total) by mouth once daily 30 tablet 11  tirzepatide (MOUNJARO) 5 mg/0.5 mL PnIj Inject 0.5 mLs (5 mg total) subcutaneously every 7 (seven) days 2 mL 2   Allergies:  Lodine [Etodolac] Hives  Penicillin Other (in infancy)   Past Medical History:  Diabetes mellitus without complication (CMS-HCC)  Hyperlipidemia  Hypertension   Past Surgical History:  COLONOSCOPY 12/26/2017 (Tubulovillous Adenoma: CBF 12/2020)  Colon @ [facility] 04/05/2022 (Tubular adenoma/PHx CP/Repeat 34yrCTL)  [facility] (Normal examined EGD/Repeat PRN/CTL)  CHOLECYSTECTOMY  knee surgery   Family History:  High blood pressure (Hypertension) Mother  Cancer Mother  High blood pressure (Hypertension) Father  Cancer Father  Heart failure Father  Diabetes Sister  High blood pressure (Hypertension) Sister  Cancer Sister  Diabetes Maternal Grandmother   Social History:   Socioeconomic History:  Marital status: Legally Separated  Tobacco Use  Smoking status: Every Day  Smokeless tobacco: Never  Vaping Use  Vaping Use: Never used  Substance and Sexual Activity  Alcohol use: Yes  Sexual activity: Defer   Review of Systems:  A comprehensive 14 point ROS was performed, reviewed, and the pertinent orthopaedic findings are documented in the HPI.  Physical  Exam: Vitals:  09/06/22 0953  BP: 124/86  Weight: (!) 104.1 kg (229 lb 9.6 oz)  Height: 167.6 cm ('5\' 6"'$ )  PainSc: 7  PainLoc: Knee   General/Constitutional: Pleasant overweight middle-aged female in no acute distress. Neuro/Psych: Normal mood and affect, oriented to person, place and time. Eyes: Non-icteric. Pupils  are equal, round, and reactive to light, and exhibit synchronous movement. ENT: Unremarkable. Lymphatic: No palpable adenopathy. Respiratory: Lungs clear to auscultation, Normal chest excursion, No wheezes, and Non-labored breathing Cardiovascular: Regular rate and rhythm. No murmurs. and No edema, swelling or tenderness, except as noted in detailed exam. Integumentary: No impressive skin lesions present, except as noted in detailed exam. Musculoskeletal: Unremarkable, except as noted in detailed exam.  Right knee exam: GAIT: Mildly antalgic gait, but uses no assistive devices. ALIGNMENT: Normal SKIN: Well-healed incisions, otherwise unremarkable SWELLING: Mild swelling EFFUSION: Small WARMTH: None TENDERNESS: Moderately tender along medial joint line, mildly tender along lateral joint line ROM: 5-110 degrees with pain in maximal flexion McMURRAY'S: Equivocally positive PATELLOFEMORAL: Normal tracking with no peri-patellar tenderness and negative apprehension sign CREPITUS: Minimal patellofemoral crepitus LACHMAN'S: Negative PIVOT SHIFT: Negative ANTERIOR DRAWER: Negative POSTERIOR DRAWER: Negative VARUS/VALGUS: Stable  She again is neurovascularly intact to the right lower extremity and foot.  X-rays/MRI/Lab data:  Standing AP and lateral x-rays of the right knee, as well as a sunrise view, are obtained. These films demonstrate early degenerative changes of the right knee, primarily involving the medial compartment, with approximately 20% loss of the medial compartment clear space. There also is evidence of some osteophytes along the lateral margin of the lateral femoral condyle with overall satisfactory maintenance of the clear space laterally. The patellofemoral compartment also appears to be well-maintained. No lytic lesions or fractures are identified.  Standing AP and lateral x-rays of the left knee, as well as a sunrise view, are obtained. These films demonstrate no evidence for  fractures, lytic lesions, or significant degenerative changes.  Assessment: 1. Primary osteoarthritis of right knee. 2. Complex tear of medial meniscus of right knee.  Plan: The treatment options were discussed with the patient. In addition, patient educational materials were provided regarding the diagnosis and treatment options. The patient is quite frustrated by her symptoms and functional limitations, especially as they pertain to her right knee, and is ready to consider more aggressive treatment options. Therefore, I have recommended a surgical procedure, specifically a right knee arthroscopy with debridement, partial medial and/or lateral meniscectomies, and reinjection of harvested fat cells. The procedure was discussed with the patient, as were the potential risks (including bleeding, infection, nerve and/or blood vessel injury, persistent or recurrent pain, failure of the repair, progression of arthritis, need for further surgery, blood clots, strokes, heart attacks and/or arhythmias, pneumonia, etc.) and benefits. The patient states her understanding and wishes to proceed. All of the patient's questions and concerns were answered. She can call any time with further concerns. She will follow up post-surgery, routine.    H&P reviewed and patient re-examined. No changes.

## 2022-10-02 NOTE — Anesthesia Procedure Notes (Signed)
Procedure Name: Intubation Date/Time: 10/02/2022 7:43 AM  Performed by: Beverely Low, CRNAPre-anesthesia Checklist: Patient identified, Patient being monitored, Timeout performed, Emergency Drugs available and Suction available Patient Re-evaluated:Patient Re-evaluated prior to induction Oxygen Delivery Method: Circle system utilized Preoxygenation: Pre-oxygenation with 100% oxygen Induction Type: IV induction Ventilation: Mask ventilation without difficulty Laryngoscope Size: 3 and McGraph Grade View: Grade I Tube type: Oral Tube size: 7.0 mm Number of attempts: 1 Airway Equipment and Method: Stylet Placement Confirmation: ETT inserted through vocal cords under direct vision, positive ETCO2 and breath sounds checked- equal and bilateral Secured at: 21 cm Tube secured with: Tape Dental Injury: Teeth and Oropharynx as per pre-operative assessment

## 2022-10-03 ENCOUNTER — Encounter: Payer: Self-pay | Admitting: Surgery

## 2022-10-20 ENCOUNTER — Other Ambulatory Visit: Payer: Self-pay | Admitting: Orthopedic Surgery

## 2022-10-20 ENCOUNTER — Ambulatory Visit
Admission: RE | Admit: 2022-10-20 | Discharge: 2022-10-20 | Disposition: A | Discharge status: Home / Self Care | Payer: BC Managed Care – PPO | Source: Ambulatory Visit | Attending: Orthopedic Surgery | Admitting: Orthopedic Surgery

## 2022-10-20 DIAGNOSIS — R2241 Localized swelling, mass and lump, right lower limb: Secondary | ICD-10-CM | POA: Diagnosis not present

## 2022-10-20 DIAGNOSIS — M25471 Effusion, right ankle: Secondary | ICD-10-CM | POA: Diagnosis not present

## 2022-10-20 DIAGNOSIS — M7989 Other specified soft tissue disorders: Secondary | ICD-10-CM | POA: Diagnosis not present

## 2022-11-01 DIAGNOSIS — M25571 Pain in right ankle and joints of right foot: Secondary | ICD-10-CM | POA: Diagnosis not present

## 2022-11-01 DIAGNOSIS — M79671 Pain in right foot: Secondary | ICD-10-CM | POA: Diagnosis not present

## 2022-12-02 ENCOUNTER — Encounter: Payer: Self-pay | Admitting: Surgery

## 2023-02-08 DIAGNOSIS — Z886 Allergy status to analgesic agent status: Secondary | ICD-10-CM | POA: Diagnosis not present

## 2023-02-08 DIAGNOSIS — R0789 Other chest pain: Secondary | ICD-10-CM | POA: Diagnosis not present

## 2023-02-08 DIAGNOSIS — Z880 Allergy status to penicillin: Secondary | ICD-10-CM | POA: Diagnosis not present

## 2023-02-08 DIAGNOSIS — R079 Chest pain, unspecified: Secondary | ICD-10-CM | POA: Diagnosis not present

## 2023-02-08 DIAGNOSIS — E785 Hyperlipidemia, unspecified: Secondary | ICD-10-CM | POA: Diagnosis not present

## 2023-02-08 DIAGNOSIS — E119 Type 2 diabetes mellitus without complications: Secondary | ICD-10-CM | POA: Diagnosis not present

## 2023-02-08 DIAGNOSIS — Z7985 Long-term (current) use of injectable non-insulin antidiabetic drugs: Secondary | ICD-10-CM | POA: Diagnosis not present

## 2023-02-08 DIAGNOSIS — I10 Essential (primary) hypertension: Secondary | ICD-10-CM | POA: Diagnosis not present

## 2023-02-11 DIAGNOSIS — R0789 Other chest pain: Secondary | ICD-10-CM | POA: Diagnosis not present

## 2023-02-28 ENCOUNTER — Encounter: Payer: Self-pay | Admitting: Nurse Practitioner

## 2023-02-28 ENCOUNTER — Ambulatory Visit: Payer: BC Managed Care – PPO | Admitting: Nurse Practitioner

## 2023-02-28 VITALS — BP 132/84 | HR 79 | Temp 98.3°F | Ht 66.0 in | Wt 214.0 lb

## 2023-02-28 DIAGNOSIS — I152 Hypertension secondary to endocrine disorders: Secondary | ICD-10-CM

## 2023-02-28 DIAGNOSIS — Z7985 Long-term (current) use of injectable non-insulin antidiabetic drugs: Secondary | ICD-10-CM

## 2023-02-28 DIAGNOSIS — E1142 Type 2 diabetes mellitus with diabetic polyneuropathy: Secondary | ICD-10-CM | POA: Diagnosis not present

## 2023-02-28 DIAGNOSIS — E785 Hyperlipidemia, unspecified: Secondary | ICD-10-CM

## 2023-02-28 DIAGNOSIS — Z1231 Encounter for screening mammogram for malignant neoplasm of breast: Secondary | ICD-10-CM

## 2023-02-28 DIAGNOSIS — E1159 Type 2 diabetes mellitus with other circulatory complications: Secondary | ICD-10-CM | POA: Diagnosis not present

## 2023-02-28 DIAGNOSIS — J309 Allergic rhinitis, unspecified: Secondary | ICD-10-CM

## 2023-02-28 DIAGNOSIS — Z7984 Long term (current) use of oral hypoglycemic drugs: Secondary | ICD-10-CM

## 2023-02-28 DIAGNOSIS — Z720 Tobacco use: Secondary | ICD-10-CM

## 2023-02-28 DIAGNOSIS — E1169 Type 2 diabetes mellitus with other specified complication: Secondary | ICD-10-CM | POA: Diagnosis not present

## 2023-02-28 DIAGNOSIS — Z1329 Encounter for screening for other suspected endocrine disorder: Secondary | ICD-10-CM

## 2023-02-28 DIAGNOSIS — K219 Gastro-esophageal reflux disease without esophagitis: Secondary | ICD-10-CM

## 2023-02-28 LAB — CBC WITH DIFFERENTIAL/PLATELET
Basophils Absolute: 0 10*3/uL (ref 0.0–0.1)
Basophils Relative: 0.5 % (ref 0.0–3.0)
Eosinophils Absolute: 0.2 10*3/uL (ref 0.0–0.7)
Eosinophils Relative: 1.7 % (ref 0.0–5.0)
HCT: 47.4 % — ABNORMAL HIGH (ref 36.0–46.0)
Hemoglobin: 15.8 g/dL — ABNORMAL HIGH (ref 12.0–15.0)
Lymphocytes Relative: 32.9 % (ref 12.0–46.0)
Lymphs Abs: 3.2 10*3/uL (ref 0.7–4.0)
MCHC: 33.3 g/dL (ref 30.0–36.0)
MCV: 88.8 fl (ref 78.0–100.0)
Monocytes Absolute: 0.7 10*3/uL (ref 0.1–1.0)
Monocytes Relative: 7.4 % (ref 3.0–12.0)
Neutro Abs: 5.5 10*3/uL (ref 1.4–7.7)
Neutrophils Relative %: 57.5 % (ref 43.0–77.0)
Platelets: 278 10*3/uL (ref 150.0–400.0)
RBC: 5.34 Mil/uL — ABNORMAL HIGH (ref 3.87–5.11)
RDW: 13.6 % (ref 11.5–15.5)
WBC: 9.6 10*3/uL (ref 4.0–10.5)

## 2023-02-28 LAB — COMPREHENSIVE METABOLIC PANEL
ALT: 17 U/L (ref 0–35)
AST: 15 U/L (ref 0–37)
Albumin: 4.4 g/dL (ref 3.5–5.2)
Alkaline Phosphatase: 84 U/L (ref 39–117)
BUN: 10 mg/dL (ref 6–23)
CO2: 27 mEq/L (ref 19–32)
Calcium: 9.9 mg/dL (ref 8.4–10.5)
Chloride: 104 mEq/L (ref 96–112)
Creatinine, Ser: 0.64 mg/dL (ref 0.40–1.20)
GFR: 98.28 mL/min (ref >60.00)
Glucose, Bld: 98 mg/dL (ref 70–99)
Potassium: 4 mEq/L (ref 3.5–5.1)
Sodium: 139 mEq/L (ref 135–145)
Total Bilirubin: 0.3 mg/dL (ref 0.2–1.2)
Total Protein: 7.1 g/dL (ref 6.0–8.3)

## 2023-02-28 LAB — LIPID PANEL
Cholesterol: 107 mg/dL (ref 0–200)
HDL: 39.5 mg/dL (ref >39.00)
LDL Cholesterol: 33 mg/dL (ref 0–99)
NonHDL: 67.2
Total CHOL/HDL Ratio: 3
Triglycerides: 173 mg/dL — ABNORMAL HIGH (ref 0.0–149.0)
VLDL: 34.6 mg/dL (ref 0.0–40.0)

## 2023-02-28 LAB — TSH: TSH: 1.05 u[IU]/mL (ref 0.35–5.50)

## 2023-02-28 LAB — HEMOGLOBIN A1C: Hgb A1c MFr Bld: 9.9 % — ABNORMAL HIGH (ref 4.6–6.5)

## 2023-02-28 MED ORDER — FLUTICASONE PROPIONATE 50 MCG/ACT NA SUSP: 2.0000 | Freq: Every day | NASAL | 3 refills | Status: DC

## 2023-02-28 MED ORDER — ESOMEPRAZOLE MAGNESIUM 40 MG PO CPDR: 40.0000 mg | DELAYED_RELEASE_CAPSULE | Freq: Every day | ORAL | 3 refills | Status: DC

## 2023-02-28 MED ORDER — ALBUTEROL SULFATE HFA 108 (90 BASE) MCG/ACT IN AERS: 2.0000 | INHALATION_SPRAY | Freq: Four times a day (QID) | RESPIRATORY_TRACT | 2 refills | Status: AC | PRN

## 2023-02-28 NOTE — Patient Instructions (Signed)
YOUR MAMMOGRAM IS DUE, PLEASE CALL AND GET THIS SCHEDULED! [facility] - call [phone]    

## 2023-02-28 NOTE — Progress Notes (Signed)
Bethanie Dicker, NP-C Phone: [phone]  Kristin Cherry is a 57 y.o. female who presents today for transfer of care. She has no complaints or new concerns today. She is doing well on all of her medications. She is followed by Endocrinology.   HYPERTENSION Disease Monitoring: Blood pressure range- Not checking Chest pain- No      Dyspnea- No Medications: Compliance- Lisinopril Lightheadedness- No   Edema- No  Lab Results  Component Value Date   NA 140 11/13/2021   K 3.9 11/13/2021   CO2 29 11/13/2021   GLUCOSE 111 (H) 11/13/2021   BUN 14 11/13/2021   CREATININE 0.76 11/13/2021   CALCIUM 9.4 11/13/2021   EGFR 102 02/26/2021   GFRNONAA 100 08/03/2020    DIABETES Disease Monitoring: Blood Sugar ranges- 125-140 Polyuria/phagia/dipsia- No      Optho- Yes Medications: Compliance- Ozempic, Farxiga, Glimepiride Hypoglycemic symptoms- No Last A1c- September 2023- 9.9 Lab Results  Component Value Date   HGBA1C 7.3 (H) 11/13/2021     HYPERLIPIDEMIA Disease Monitoring: See symptoms for Hypertension Medications: Compliance- Crestor Right upper quadrant pain- No  Muscle aches- No  Lab Results  Component Value Date   CHOL 172 11/13/2021   HDL 40.50 11/13/2021   LDLCALC 60 02/26/2021   LDLDIRECT 110.0 11/13/2021   TRIG 235.0 (H) 11/13/2021   CHOLHDL 4 11/13/2021    GERD:   Reflux symptoms: None with medication   Abd pain: No   Blood in stool: No  Dysphagia: No   EGD: 04/05/2022  Medication: Nexium 40 mg   Social History   Tobacco Use  Smoking Status Every Day   Packs/day: 1.00   Years: 41.00   Additional pack years: 0.00   Total pack years: 41.00   Types: E-cigarettes, Cigarettes  Smokeless Tobacco Never  Tobacco Comments   Risks of continued tobacco use were discussed. She is not currently interested in tobacco cessation.      Current Outpatient Medications on File Prior to Visit  Medication Sig Dispense Refill   cyclobenzaprine (FLEXERIL) 10 MG tablet Take  1 tablet (10 mg total) by mouth 3 (three) times daily as needed for muscle spasms (will cause drowsiness.). 30 tablet 0   FARXIGA 10 MG TABS tablet Take 10 mg by mouth every morning.     glimepiride (AMARYL) 2 MG tablet Take 1 tablet by mouth daily with breakfast.     HYDROcodone-acetaminophen (NORCO) 5-325 MG tablet Take 1-2 tablets by mouth every 6 (six) hours as needed for moderate pain or severe pain. MAXIMUM TOTAL ACETAMINOPHEN DOSE IS 4000 MG PER DAY 30 tablet 0   lisinopril (ZESTRIL) 20 MG tablet Take 20 mg by mouth every morning.     naproxen sodium (ALEVE) 220 MG tablet Take 220 mg by mouth daily.     OZEMPIC, 2 MG/DOSE, 8 MG/3ML SOPN Inject into the skin.     rosuvastatin (CRESTOR) 10 MG tablet Take 10 mg by mouth every morning.     [DISCONTINUED] metFORMIN (GLUCOPHAGE) 1000 MG tablet Take 1,000 mg by mouth 2 (two) times daily with a meal.     No current facility-administered medications on file prior to visit.    ROS see history of present illness  Objective  Physical Exam Vitals:   02/28/23 1306  BP: 132/84  Pulse: 79  Temp: 98.3 F (36.8 C)  SpO2: 96%    BP Readings from Last 3 Encounters:  02/28/23 132/84  10/02/22 137/65  04/05/22 (!) 148/84   Wt Readings from Last 3 Encounters:  02/28/23 214 lb (97.1 kg)  10/02/22 229 lb 8 oz (104.1 kg)  09/19/22 229 lb 8 oz (104.1 kg)    Physical Exam Constitutional:      General: She is not in acute distress.    Appearance: Normal appearance.  HENT:     Head: Normocephalic.  Cardiovascular:     Rate and Rhythm: Normal rate and regular rhythm.     Heart sounds: Normal heart sounds.  Pulmonary:     Effort: Pulmonary effort is normal.     Breath sounds: Normal breath sounds.  Skin:    General: Skin is warm and dry.  Neurological:     General: No focal deficit present.     Mental Status: She is alert.  Psychiatric:        Mood and Affect: Mood normal.        Behavior: Behavior normal.    Assessment/Plan:  Please see individual problem list.  Type 2 diabetes mellitus with diabetic polyneuropathy, without long-term current use of insulin (HCC) Assessment & Plan: Chronic. Currently on Ozempic, Farxiga and Glimepiride. Continue. Will check A1c today. Encouraged to continue healthy diet and exercise. Follow up with Endocrinology.  Orders: -     Comprehensive metabolic panel -     Hemoglobin A1c  Hypertension associated with diabetes (HCC) Assessment & Plan: Chronic. Stable on Lisinopril. Continue. CBC today.   Orders: -     CBC with Differential/Platelet  Hyperlipidemia associated with type 2 diabetes mellitus (HCC) Assessment & Plan: Chronic. Currently on Crestor. Continue. Will check lipids today.  Orders: -     Lipid panel  Gastroesophageal reflux disease, unspecified whether esophagitis present Assessment & Plan: Chronic. Stable on Nexium 40 mg daily. Continue. Patient requested Rx be changed from packet form to capsule. New Rx sent. Encouraged patient to monitor diet for triggers. Dietary information provided.   Orders: -     Esomeprazole Magnesium; Take 1 capsule (40 mg total) by mouth daily before breakfast.  Dispense: 90 capsule; Refill: 3  Thyroid disorder screen -     TSH  Screening mammogram for breast cancer -     3D Screening Mammogram, Left and Right; Future  Tobacco abuse -     Albuterol Sulfate HFA; Inhale 2 puffs into the lungs every 6 (six) hours as needed for wheezing or shortness of breath.  Dispense: 8 g; Refill: 2  Allergic rhinitis, unspecified seasonality, unspecified trigger -     Fluticasone Propionate; Place 2 sprays into both nostrils daily.  Dispense: 16 g; Refill: 3   Return in about 6 months (around 08/30/2023) for Annual Exam.   Bethanie Dicker, NP-C [facility] [employer]

## 2023-02-28 NOTE — Assessment & Plan Note (Addendum)
Chronic. Currently on Crestor. Continue. Will check lipids today.

## 2023-02-28 NOTE — Assessment & Plan Note (Addendum)
Chronic. Currently on Ozempic, Farxiga and Glimepiride. Continue. Will check A1c today. Encouraged to continue healthy diet and exercise. Follow up with Endocrinology.

## 2023-02-28 NOTE — Assessment & Plan Note (Signed)
Chronic. Stable on Nexium 40 mg daily. Continue. Patient requested Rx be changed from packet form to capsule. New Rx sent. Encouraged patient to monitor diet for triggers. Dietary information provided.

## 2023-02-28 NOTE — Assessment & Plan Note (Signed)
Chronic. Stable on Lisinopril. Continue. CBC today.

## 2023-03-12 ENCOUNTER — Ambulatory Visit
Admission: RE | Admit: 2023-03-12 | Discharge: 2023-03-12 | Disposition: A | Discharge status: Home / Self Care | Payer: BC Managed Care – PPO | Source: Ambulatory Visit | Attending: Nurse Practitioner | Admitting: Nurse Practitioner

## 2023-03-12 DIAGNOSIS — Z1231 Encounter for screening mammogram for malignant neoplasm of breast: Secondary | ICD-10-CM | POA: Diagnosis not present

## 2023-09-02 ENCOUNTER — Encounter: Payer: BC Managed Care – PPO | Admitting: Nurse Practitioner

## 2023-10-01 ENCOUNTER — Other Ambulatory Visit: Payer: Self-pay | Admitting: Student

## 2023-10-01 DIAGNOSIS — M170 Bilateral primary osteoarthritis of knee: Secondary | ICD-10-CM | POA: Diagnosis not present

## 2023-10-01 DIAGNOSIS — M2170 Unequal limb length (acquired), unspecified site: Secondary | ICD-10-CM

## 2023-10-01 DIAGNOSIS — S83242D Other tear of medial meniscus, current injury, left knee, subsequent encounter: Secondary | ICD-10-CM | POA: Diagnosis not present

## 2023-10-01 DIAGNOSIS — S83282D Other tear of lateral meniscus, current injury, left knee, subsequent encounter: Secondary | ICD-10-CM | POA: Diagnosis not present

## 2023-10-01 DIAGNOSIS — Z9889 Other specified postprocedural states: Secondary | ICD-10-CM | POA: Diagnosis not present

## 2023-10-07 ENCOUNTER — Ambulatory Visit
Admission: RE | Admit: 2023-10-07 | Discharge: 2023-10-07 | Disposition: A | Discharge status: Home / Self Care | Payer: BC Managed Care – PPO | Attending: Student | Admitting: Student

## 2023-10-07 ENCOUNTER — Ambulatory Visit
Admission: RE | Admit: 2023-10-07 | Discharge: 2023-10-07 | Disposition: A | Discharge status: Home / Self Care | Payer: BC Managed Care – PPO | Source: Ambulatory Visit | Attending: Student

## 2023-10-07 DIAGNOSIS — M21062 Valgus deformity, not elsewhere classified, left knee: Secondary | ICD-10-CM | POA: Diagnosis not present

## 2023-10-07 DIAGNOSIS — M21161 Varus deformity, not elsewhere classified, right knee: Secondary | ICD-10-CM | POA: Diagnosis not present

## 2023-10-07 DIAGNOSIS — M2170 Unequal limb length (acquired), unspecified site: Secondary | ICD-10-CM

## 2023-10-07 DIAGNOSIS — M1711 Unilateral primary osteoarthritis, right knee: Secondary | ICD-10-CM | POA: Diagnosis not present

## 2023-10-20 ENCOUNTER — Encounter: Payer: Self-pay | Admitting: Nurse Practitioner

## 2023-10-20 NOTE — Telephone Encounter (Signed)
 Care team updated and letter sent for eye exam notes.

## 2024-08-16 DIAGNOSIS — H04123 Dry eye syndrome of bilateral lacrimal glands: Secondary | ICD-10-CM | POA: Diagnosis not present

## 2024-08-16 DIAGNOSIS — H2513 Age-related nuclear cataract, bilateral: Secondary | ICD-10-CM | POA: Diagnosis not present

## 2024-08-16 DIAGNOSIS — H25041 Posterior subcapsular polar age-related cataract, right eye: Secondary | ICD-10-CM | POA: Diagnosis not present

## 2024-09-15 DIAGNOSIS — H04123 Dry eye syndrome of bilateral lacrimal glands: Secondary | ICD-10-CM | POA: Diagnosis not present

## 2024-09-15 DIAGNOSIS — H25041 Posterior subcapsular polar age-related cataract, right eye: Secondary | ICD-10-CM | POA: Diagnosis not present

## 2024-09-15 DIAGNOSIS — H2513 Age-related nuclear cataract, bilateral: Secondary | ICD-10-CM | POA: Diagnosis not present

## 2024-09-15 DIAGNOSIS — H2511 Age-related nuclear cataract, right eye: Secondary | ICD-10-CM | POA: Diagnosis not present

## 2024-09-24 ENCOUNTER — Encounter: Payer: Self-pay | Admitting: Nurse Practitioner

## 2024-09-24 ENCOUNTER — Ambulatory Visit (INDEPENDENT_AMBULATORY_CARE_PROVIDER_SITE_OTHER): Admitting: Nurse Practitioner

## 2024-09-24 VITALS — BP 126/80 | HR 76 | Temp 98.5°F | Ht 66.0 in | Wt 194.4 lb

## 2024-09-24 DIAGNOSIS — Z0000 Encounter for general adult medical examination without abnormal findings: Secondary | ICD-10-CM | POA: Diagnosis not present

## 2024-09-24 DIAGNOSIS — Z1329 Encounter for screening for other suspected endocrine disorder: Secondary | ICD-10-CM

## 2024-09-24 DIAGNOSIS — E1142 Type 2 diabetes mellitus with diabetic polyneuropathy: Secondary | ICD-10-CM | POA: Diagnosis not present

## 2024-09-24 DIAGNOSIS — E1159 Type 2 diabetes mellitus with other circulatory complications: Secondary | ICD-10-CM | POA: Diagnosis not present

## 2024-09-24 DIAGNOSIS — I152 Hypertension secondary to endocrine disorders: Secondary | ICD-10-CM | POA: Diagnosis not present

## 2024-09-24 DIAGNOSIS — E1169 Type 2 diabetes mellitus with other specified complication: Secondary | ICD-10-CM

## 2024-09-24 DIAGNOSIS — E785 Hyperlipidemia, unspecified: Secondary | ICD-10-CM

## 2024-09-24 NOTE — Progress Notes (Unsigned)
 Established Patient Office Visit  Subjective:  Patient ID: Kristin Cherry, female    DOB: 1965-12-29  Age: 58 y.o. MRN: [id number]  CC: No chief complaint on file.  Discussed the use of AI scribe software for clinical note transcription with the patient, who gave verbal consent to proceed.  History of Present Illness  Patient presents to the clinic for annual physical exam and has not been taking her medication from last 1 year.   Diet: Patient does/ does not eat meat. Patient consumes fruits and veggies. Patient eat some .SABRA... fried food. Patient drinks water, coffee and pepsi  Exercise:  walking daily at work Vaccine  Flu: September, 2025 Tetanus: 2023 Shingles: Due Pneumonia: completed  Colonoscopy: 2023 Cervical cancer screening: 2022  Family history:  Colon cancer: No  Breast cancer:No   Ophthalmology: yearly  HIV screening:incomplete Hep C screening:completed Tobacco use:yes Alcohol use: occasionally  Illicit drugs: No    Past Medical History:  Diagnosis Date   Anemia    Asthma    well controlled   Diabetes mellitus without complication (HCC)    GERD (gastroesophageal reflux disease)    History of bronchitis    Hyperlipidemia    Hypertension    Obesity     Past Surgical History:  Procedure Laterality Date   BREAST BIOPSY Right 11/01/2020   U/S bx, ribbon marker, path pending   CHOLECYSTECTOMY     COLONOSCOPY N/A 04/05/2022   Procedure: COLONOSCOPY;  Surgeon: Maryruth Ole DASEN, MD;  Location: [facility] ENDOSCOPY;  Service: Endoscopy;  Laterality: N/A;  DM   COLONOSCOPY WITH PROPOFOL  N/A 12/26/2017   Procedure: COLONOSCOPY WITH PROPOFOL ;  Surgeon: Viktoria Lamar DASEN, MD;  Location: [facility] ENDOSCOPY;  Service: Endoscopy;  Laterality: N/A;   ESOPHAGOGASTRODUODENOSCOPY N/A 04/05/2022   Procedure: ESOPHAGOGASTRODUODENOSCOPY (EGD);  Surgeon: Maryruth Ole DASEN, MD;  Location: [facility] ENDOSCOPY;  Service: Endoscopy;  Laterality: N/A;   KNEE ARTHROSCOPY Right  10/02/2022   Procedure: RIGHT KNEE ARTHROSCOPY WITH DEBRIDEMENT, PARTIAL MEDIAL AND LATERAL MENISCECTOMIES, AND REINJECTION OF FAT CELLS.;  Surgeon: Edie Norleen PARAS, MD;  Location: [facility] ORS;  Service: Orthopedics;  Laterality: Right;   KNEE CARTILAGE SURGERY Left 2000   KNEE SURGERY     slow the growth of right knee patient reports in 1979 and left knee cartilage repair in 2000   SALPINGOOPHORECTOMY Right     Family History  Problem Relation Age of Onset   Cancer Mother    Hypertension Mother    Hypertension Father    Cancer - Other Father    Congestive Heart Failure Father    Cancer Sister    Diabetes Sister    Hypertension Sister    Cancer Brother    Hypertension Brother    Diabetes Maternal Grandmother    Congestive Heart Failure Paternal Grandmother    Hypertension Paternal Grandfather    Diabetes Sister     Social History   Socioeconomic History   Marital status: Single    Spouse name: Not on file   Number of children: Not on file   Years of education: Not on file   Highest education level: Not on file  Occupational History   Not on file  Tobacco Use   Smoking status: Every Day    Current packs/day: 1.00    Average packs/day: 1 pack/day for 41.0 years (41.0 ttl pk-yrs)    Types: E-cigarettes, Cigarettes   Smokeless tobacco: Never   Tobacco comments:    Risks of continued tobacco use were discussed. She  is not currently interested in tobacco cessation.    Vaping Use   Vaping status: Never Used  Substance and Sexual Activity   Alcohol use: Yes    Comment: rarely   Drug use: No   Sexual activity: Yes    Birth control/protection: Post-menopausal  Other Topics Concern   Not on file  Social History Narrative   Not on file   Social Drivers of Health   Financial Resource Strain: Low Risk  (10/01/2023)   Received from [facility]   Overall Financial Resource Strain (CARDIA)    Difficulty of Paying Living Expenses: Not very hard  Food  Insecurity: Food Insecurity Present (10/01/2023)   Received from [facility]   Hunger Vital Sign    Within the past 12 months, you worried that your food would run out before you got the money to buy more.: Sometimes true    Within the past 12 months, the food you bought just didn't last and you didn't have money to get more.: Sometimes true  Transportation Needs: No Transportation Needs (10/01/2023)   Received from [facility] - Transportation    In the past 12 months, has lack of transportation kept you from medical appointments or from getting medications?: No    Lack of Transportation (Non-Medical): No  Physical Activity: Not on file  Stress: Not on file  Social Connections: Not on file  Intimate Partner Violence: Not on file     Outpatient Medications Prior to Visit  Medication Sig Dispense Refill   albuterol  (VENTOLIN  HFA) 108 (90 Base) MCG/ACT inhaler Inhale 2 puffs into the lungs every 6 (six) hours as needed for wheezing or shortness of breath. 8 g 2   cyclobenzaprine  (FLEXERIL ) 10 MG tablet Take 1 tablet (10 mg total) by mouth 3 (three) times daily as needed for muscle spasms (will cause drowsiness.). 30 tablet 0   esomeprazole  (NEXIUM ) 40 MG capsule Take 1 capsule (40 mg total) by mouth daily before breakfast. 90 capsule 3   FARXIGA  10 MG TABS tablet Take 10 mg by mouth every morning.     fluticasone  (FLONASE ) 50 MCG/ACT nasal spray Place 2 sprays into both nostrils daily. 16 g 3   glimepiride (AMARYL) 2 MG tablet Take 1 tablet by mouth daily with breakfast.     HYDROcodone -acetaminophen  (NORCO) 5-325 MG tablet Take 1-2 tablets by mouth every 6 (six) hours as needed for moderate pain or severe pain. MAXIMUM TOTAL ACETAMINOPHEN  DOSE IS 4000 MG PER DAY 30 tablet 0   lisinopril  (ZESTRIL ) 20 MG tablet Take 20 mg by mouth every morning.     naproxen  sodium (ALEVE ) 220 MG tablet Take 220 mg by mouth daily.     OZEMPIC , 2 MG/DOSE, 8 MG/3ML  SOPN Inject into the skin.     rosuvastatin  (CRESTOR ) 10 MG tablet Take 10 mg by mouth every morning.     No facility-administered medications prior to visit.    Allergies  Allergen Reactions   Penicillins Hives and Other (See Comments)   Lodine [Etodolac] Hives   Penicillin G     ROS Review of Systems Negative unless indicated in HPI.    Objective:    Physical Exam  LMP 10/02/2018 (Exact Date)  Wt Readings from Last 3 Encounters:  02/28/23 214 lb (97.1 kg)  10/02/22 229 lb 8 oz (104.1 kg)  09/19/22 229 lb 8 oz (104.1 kg)     Health Maintenance  Topic Date Due  HIV Screening  Never done   Diabetic kidney evaluation - Urine ACR  Never done   Hepatitis B Vaccines 19-59 Average Risk (1 of 3 - 19+ 3-dose series) Never done   Zoster Vaccines- Shingrix (1 of 2) Never done   Lung Cancer Screening  Never done   OPHTHALMOLOGY EXAM  10/10/2022   FOOT EXAM  11/13/2022   HEMOGLOBIN A1C  08/30/2023   Diabetic kidney evaluation - eGFR measurement  02/28/2024   COVID-19 Vaccine (5 - 2025-26 season) 07/05/2024   Mammogram  03/11/2025   Cervical Cancer Screening (HPV/Pap Cotest)  02/26/2026   DTaP/Tdap/Td (4 - Td or Tdap) 12/05/2031   Colonoscopy  04/05/2032   Pneumococcal Vaccine: 50+ Years  Completed   Influenza Vaccine  Completed   Hepatitis C Screening  Completed   HPV VACCINES  Aged Out   Meningococcal B Vaccine  Aged Out       Topic Date Due   Hepatitis B Vaccines 19-59 Average Risk (1 of 3 - 19+ 3-dose series) Never done    Lab Results  Component Value Date   TSH 1.05 02/28/2023   Lab Results  Component Value Date   WBC 9.6 02/28/2023   HGB 15.8 (H) 02/28/2023   HCT 47.4 (H) 02/28/2023   MCV 88.8 02/28/2023   PLT 278.0 02/28/2023   Lab Results  Component Value Date   NA 139 02/28/2023   K 4.0 02/28/2023   CO2 27 02/28/2023   GLUCOSE 98 02/28/2023   BUN 10 02/28/2023   CREATININE 0.64 02/28/2023   BILITOT 0.3 02/28/2023   ALKPHOS 84 02/28/2023    AST 15 02/28/2023   ALT 17 02/28/2023   PROT 7.1 02/28/2023   ALBUMIN 4.4 02/28/2023   CALCIUM  9.9 02/28/2023   ANIONGAP 11 05/03/2019   EGFR 102 02/26/2021   GFR 98.28 02/28/2023   Lab Results  Component Value Date   CHOL 107 02/28/2023   Lab Results  Component Value Date   HDL 39.50 02/28/2023   Lab Results  Component Value Date   LDLCALC 33 02/28/2023   Lab Results  Component Value Date   TRIG 173.0 (H) 02/28/2023   Lab Results  Component Value Date   CHOLHDL 3 02/28/2023   Lab Results  Component Value Date   HGBA1C 9.9 (H) 02/28/2023      Assessment & Plan:   Assessment & Plan Annual physical exam     Type 2 diabetes mellitus with diabetic polyneuropathy, without long-term current use of insulin (HCC)      Assessment and Plan Assessment & Plan       Follow-up: No follow-ups on file.   Jadarion Halbig, NP

## 2024-09-26 ENCOUNTER — Ambulatory Visit: Payer: Self-pay | Admitting: Nurse Practitioner

## 2024-09-26 LAB — CBC WITH DIFFERENTIAL/PLATELET
Basophils Absolute: 0 x10E3/uL (ref 0.0–0.2)
Basos: 0 %
EOS (ABSOLUTE): 0.1 x10E3/uL (ref 0.0–0.4)
Eos: 1 %
Hematocrit: 42.5 % (ref 34.0–46.6)
Hemoglobin: 14 g/dL (ref 11.1–15.9)
Immature Grans (Abs): 0.1 x10E3/uL (ref 0.0–0.1)
Immature Granulocytes: 1 %
Lymphocytes Absolute: 3.8 x10E3/uL — ABNORMAL HIGH (ref 0.7–3.1)
Lymphs: 38 %
MCH: 30.6 pg (ref 26.6–33.0)
MCHC: 32.9 g/dL (ref 31.5–35.7)
MCV: 93 fL (ref 79–97)
Monocytes Absolute: 0.7 x10E3/uL (ref 0.1–0.9)
Monocytes: 7 %
Neutrophils Absolute: 5.3 x10E3/uL (ref 1.4–7.0)
Neutrophils: 53 %
Platelets: 260 x10E3/uL (ref 150–450)
RBC: 4.58 x10E6/uL (ref 3.77–5.28)
RDW: 12 % (ref 11.7–15.4)
WBC: 10 x10E3/uL (ref 3.4–10.8)

## 2024-09-26 LAB — COMPREHENSIVE METABOLIC PANEL WITH GFR
ALT: 13 IU/L (ref 0–32)
AST: 12 IU/L (ref 0–40)
Albumin: 4 g/dL (ref 3.8–4.9)
Alkaline Phosphatase: 121 IU/L (ref 49–135)
BUN/Creatinine Ratio: 11 (ref 9–23)
BUN: 7 mg/dL (ref 6–24)
Bilirubin Total: 0.2 mg/dL (ref 0.0–1.2)
CO2: 24 mmol/L (ref 20–29)
Calcium: 9.4 mg/dL (ref 8.7–10.2)
Chloride: 95 mmol/L — ABNORMAL LOW (ref 96–106)
Creatinine, Ser: 0.64 mg/dL (ref 0.57–1.00)
Globulin, Total: 2.5 g/dL (ref 1.5–4.5)
Glucose: 342 mg/dL — ABNORMAL HIGH (ref 70–99)
Potassium: 4.2 mmol/L (ref 3.5–5.2)
Sodium: 135 mmol/L (ref 134–144)
Total Protein: 6.5 g/dL (ref 6.0–8.5)
eGFR: 102 mL/min/1.73 (ref >59)

## 2024-09-26 LAB — HEMOGLOBIN A1C
Est. average glucose Bld gHb Est-mCnc: >398 mg/dL
Hgb A1c MFr Bld: >15.5 % — AB (ref 4.8–5.6)

## 2024-09-26 LAB — TSH: TSH: 0.584 u[IU]/mL (ref 0.450–4.500)

## 2024-09-26 LAB — LIPID PANEL
Chol/HDL Ratio: 5.7 ratio — ABNORMAL HIGH (ref 0.0–4.4)
Cholesterol, Total: 211 mg/dL — ABNORMAL HIGH (ref 100–199)
HDL: 37 mg/dL — ABNORMAL LOW (ref >39)
LDL Chol Calc (NIH): 123 mg/dL — ABNORMAL HIGH (ref 0–99)
Triglycerides: 289 mg/dL — ABNORMAL HIGH (ref 0–149)
VLDL Cholesterol Cal: 51 mg/dL — ABNORMAL HIGH (ref 5–40)

## 2024-09-26 MED ORDER — LANTUS SOLOSTAR 100 UNIT/ML ~~LOC~~ SOPN: 10.0000 [IU] | PEN_INJECTOR | Freq: Every day | SUBCUTANEOUS | 1 refills | Status: DC

## 2024-09-26 MED ORDER — ROSUVASTATIN CALCIUM 10 MG PO TABS
10.0000 mg | ORAL_TABLET | Freq: Every day | ORAL | 0 refills | Status: AC
Start: 2024-09-26 — End: ?

## 2024-09-26 NOTE — Progress Notes (Signed)
 Kristin Cherry  I saw this patient on 09/24/2024 for annual physical.  She reported that she has not been taking any of her prescribed medications from past year.  She has not seen endocrinology since 2023.  Her A1c > 15%. During the visit she also informed me that she is scheduled to have cataract surgery in December.  I will fax the lab result to [facility].  Vincente Clinial: Please fax the lab results to [facility] attention Dr. Enola and inform patient:  Her A1c is more than 15.5%.  Please schedule her for nurse visit to place CGM monitor and provide sample for Farxiga  10 mg (2-week supply) and Ozempic  0.25 mg.  I will also start her on insulin 10 units at nighttime as discussed during office visit.   Her cholesterol levels are elevated.  I have also sent prescription of Crestor  10 mg. Other labs stable.  Please schedule follow-up in 2 weeks to send check CGM monitor readings either with PCP or me.

## 2024-09-27 ENCOUNTER — Encounter: Payer: Self-pay | Admitting: Nurse Practitioner

## 2024-09-27 ENCOUNTER — Other Ambulatory Visit

## 2024-09-27 NOTE — Progress Notes (Signed)
 Noted! Thank you

## 2024-09-28 ENCOUNTER — Other Ambulatory Visit: Payer: Self-pay

## 2024-09-28 ENCOUNTER — Ambulatory Visit (INDEPENDENT_AMBULATORY_CARE_PROVIDER_SITE_OTHER)

## 2024-09-28 DIAGNOSIS — E1142 Type 2 diabetes mellitus with diabetic polyneuropathy: Secondary | ICD-10-CM

## 2024-09-28 MED ORDER — PEN NEEDLES 32G X 6 MM MISC: 0 refills | Status: AC

## 2024-09-28 NOTE — Telephone Encounter (Unsigned)
 Copied from CRM [id number]. Topic: Clinical - Prescription Issue >> Sep 28, 2024  9:39 AM Kristin Cherry wrote: Reason for CRM: Kristin Cherry Prior Pharmacy, recieved rx for lantus  solostar pen not needles, please review and adivse.  C/b: [phone] F: [phone]

## 2024-09-29 NOTE — Progress Notes (Signed)
 Pt presented in office to have CGM monitor placed she was given Dexcom G7 we got her set up on the phone Sensor  was placed on right arm pt verbalized understanding of how to use and connect. Pt was also given Samples of Farxiga  10 mg 2 boxes with 7 tablets in each. And also  1 box of Ozempic  0.25 per order placed by Charanpreet on 09/27/24

## 2024-09-30 ENCOUNTER — Encounter: Payer: Self-pay | Admitting: Nurse Practitioner

## 2024-09-30 MED ORDER — FARXIGA 10 MG PO TABS: 10.0000 mg | ORAL_TABLET | ORAL | 1 refills | Status: DC

## 2024-09-30 MED ORDER — SEMAGLUTIDE(0.25 OR 0.5MG/DOS) 2 MG/3ML ~~LOC~~ SOPN: 0.5000 mg | PEN_INJECTOR | SUBCUTANEOUS | 2 refills | Status: DC

## 2024-09-30 NOTE — Assessment & Plan Note (Signed)
  Orders:   CBC with Differential/Platelet

## 2024-09-30 NOTE — Assessment & Plan Note (Signed)
 Orders:    Lipid panel

## 2024-10-04 ENCOUNTER — Encounter: Payer: Self-pay | Admitting: Ophthalmology

## 2024-10-04 NOTE — Discharge Instructions (Signed)

## 2024-10-04 NOTE — Telephone Encounter (Signed)
 Patient needs CGM called in. The one placed in clinic runs out in 5 days.

## 2024-10-05 ENCOUNTER — Other Ambulatory Visit: Payer: Self-pay | Admitting: Nurse Practitioner

## 2024-10-05 MED ORDER — DEXCOM G7 SENSOR MISC: 3 refills | Status: DC

## 2024-10-05 NOTE — Anesthesia Preprocedure Evaluation (Signed)
 Anesthesia Evaluation  Patient identified by MRN, date of birth, ID band Patient awake    Reviewed: Allergy & Precautions, H&P , NPO status , Patient's Chart, lab work & pertinent test results  Airway Mallampati: III  TM Distance: >3 FB     Dental  (+) Upper Dentures   Pulmonary neg pulmonary ROS, asthma , Current Smoker          Cardiovascular hypertension, negative cardio ROS      Neuro/Psych  Neuromuscular disease negative neurological ROS  negative psych ROS   GI/Hepatic negative GI ROS, Neg liver ROS,GERD  ,,  Endo/Other  negative endocrine ROSdiabetes    Renal/GU negative Renal ROS  negative genitourinary   Musculoskeletal negative musculoskeletal ROS (+) Arthritis ,    Abdominal   Peds negative pediatric ROS (+)  Hematology negative hematology ROS (+)   Anesthesia Other Findings Last ozempic  10-02-24 Wears wig, but it is on very nicely, just use caution when placing cap for surgery  HTN Asthma Arthritis Obesity BMI 31.38 Hx bronchitis GERD Diabetes mellitus HTN Hyperlipidemia   Reproductive/Obstetrics negative OB ROS                              Anesthesia Physical Anesthesia Plan  ASA: 3  Anesthesia Plan: MAC   Post-op Pain Management:    Induction: Intravenous  PONV Risk Score and Plan:   Airway Management Planned: Natural Airway and Nasal Cannula  Additional Equipment:   Intra-op Plan:   Post-operative Plan:   Informed Consent: I have reviewed the patients History and Physical, chart, labs and discussed the procedure including the risks, benefits and alternatives for the proposed anesthesia with the patient or authorized representative who has indicated his/her understanding and acceptance.     Dental Advisory Given  Plan Discussed with: Anesthesiologist, CRNA and Surgeon  Anesthesia Plan Comments: (Patient consented for risks of anesthesia  including but not limited to:  - adverse reactions to medications - damage to eyes, teeth, lips or other oral mucosa - nerve damage due to positioning  - sore throat or hoarseness - Damage to heart, brain, nerves, lungs, other parts of body or loss of life  Patient voiced understanding and assent.)         Anesthesia Quick Evaluation

## 2024-10-06 ENCOUNTER — Other Ambulatory Visit (HOSPITAL_COMMUNITY): Payer: Self-pay

## 2024-10-06 ENCOUNTER — Telehealth: Payer: Self-pay | Admitting: Pharmacy Technician

## 2024-10-06 NOTE — Telephone Encounter (Signed)
 Patient needs a PA for the Dexcom G7 CGM.

## 2024-10-06 NOTE — Telephone Encounter (Signed)
 Pharmacy Patient Advocate Encounter   Received notification from Onbase that prior authorization for Dexcom G7 Sensor  is required/requested.   Insurance verification completed.   The patient is insured through [employer].   Per test claim: PA required; PA submitted to above mentioned insurance via Latent Key/confirmation #/EOC [id number] Status is pending

## 2024-10-07 ENCOUNTER — Encounter: Payer: Self-pay | Admitting: Ophthalmology

## 2024-10-07 ENCOUNTER — Ambulatory Visit: Payer: Self-pay | Admitting: Anesthesiology

## 2024-10-07 ENCOUNTER — Ambulatory Visit
Admission: RE | Admit: 2024-10-07 | Discharge: 2024-10-07 | Disposition: A | Attending: Ophthalmology | Admitting: Ophthalmology

## 2024-10-07 ENCOUNTER — Other Ambulatory Visit: Payer: Self-pay

## 2024-10-07 ENCOUNTER — Encounter: Admission: RE | Disposition: A | Payer: Self-pay | Source: Home / Self Care | Attending: Ophthalmology

## 2024-10-07 DIAGNOSIS — Z5941 Food insecurity: Secondary | ICD-10-CM | POA: Diagnosis not present

## 2024-10-07 DIAGNOSIS — K219 Gastro-esophageal reflux disease without esophagitis: Secondary | ICD-10-CM | POA: Diagnosis not present

## 2024-10-07 DIAGNOSIS — I10 Essential (primary) hypertension: Secondary | ICD-10-CM | POA: Diagnosis not present

## 2024-10-07 DIAGNOSIS — Z8249 Family history of ischemic heart disease and other diseases of the circulatory system: Secondary | ICD-10-CM | POA: Diagnosis not present

## 2024-10-07 DIAGNOSIS — F17210 Nicotine dependence, cigarettes, uncomplicated: Secondary | ICD-10-CM | POA: Diagnosis not present

## 2024-10-07 DIAGNOSIS — Z833 Family history of diabetes mellitus: Secondary | ICD-10-CM | POA: Diagnosis not present

## 2024-10-07 DIAGNOSIS — M1990 Unspecified osteoarthritis, unspecified site: Secondary | ICD-10-CM | POA: Diagnosis not present

## 2024-10-07 DIAGNOSIS — H25041 Posterior subcapsular polar age-related cataract, right eye: Secondary | ICD-10-CM | POA: Diagnosis not present

## 2024-10-07 DIAGNOSIS — G709 Myoneural disorder, unspecified: Secondary | ICD-10-CM | POA: Diagnosis not present

## 2024-10-07 DIAGNOSIS — H2511 Age-related nuclear cataract, right eye: Secondary | ICD-10-CM | POA: Diagnosis not present

## 2024-10-07 DIAGNOSIS — E1136 Type 2 diabetes mellitus with diabetic cataract: Secondary | ICD-10-CM | POA: Diagnosis not present

## 2024-10-07 DIAGNOSIS — J45909 Unspecified asthma, uncomplicated: Secondary | ICD-10-CM | POA: Diagnosis not present

## 2024-10-07 HISTORY — PX: CATARACT EXTRACTION W/PHACO: SHX586

## 2024-10-07 HISTORY — DX: Unspecified osteoarthritis, unspecified site: M19.90

## 2024-10-07 LAB — GLUCOSE, CAPILLARY: Glucose-Capillary: 265 mg/dL — ABNORMAL HIGH (ref 70–99)

## 2024-10-07 SURGERY — PHACOEMULSIFICATION, CATARACT, WITH IOL INSERTION
Anesthesia: Topical | Site: Eye | Laterality: Right

## 2024-10-07 MED ORDER — TETRACAINE HCL 0.5 % OP SOLN
1.0000 [drp] | OPHTHALMIC | Status: DC | PRN
  Administered 2024-10-07 (×3): 1 [drp] via OPHTHALMIC

## 2024-10-07 MED ORDER — SIGHTPATH DOSE#1 NA HYALUR & NA CHOND-NA HYALUR IO KIT
PACK | INTRAOCULAR | Status: DC | PRN
  Administered 2024-10-07: 1 via OPHTHALMIC

## 2024-10-07 MED ORDER — PHENYLEPHRINE HCL 10 % OP SOLN
OPHTHALMIC | Status: AC
  Filled 2024-10-07: qty 5

## 2024-10-07 MED ORDER — BRIMONIDINE TARTRATE-TIMOLOL 0.2-0.5 % OP SOLN
OPHTHALMIC | Status: DC | PRN
  Administered 2024-10-07: 1 [drp] via OPHTHALMIC

## 2024-10-07 MED ORDER — LACTATED RINGERS IV SOLN: INTRAVENOUS | Status: DC

## 2024-10-07 MED ORDER — PHENYLEPHRINE-KETOROLAC 1-0.3 % IO SOLN
INTRAOCULAR | Status: DC | PRN
  Administered 2024-10-07: 48 mL via OPHTHALMIC

## 2024-10-07 MED ORDER — MIDAZOLAM HCL (PF) 2 MG/2ML IJ SOLN
INTRAMUSCULAR | Status: DC | PRN
  Administered 2024-10-07: 2 mg via INTRAVENOUS

## 2024-10-07 MED ORDER — LIDOCAINE HCL (PF) 2 % IJ SOLN
INTRAOCULAR | Status: DC | PRN
  Administered 2024-10-07: 4 mL via INTRAOCULAR

## 2024-10-07 MED ORDER — FENTANYL CITRATE (PF) 100 MCG/2ML IJ SOLN
INTRAMUSCULAR | Status: AC
  Filled 2024-10-07: qty 2

## 2024-10-07 MED ORDER — FENTANYL CITRATE (PF) 100 MCG/2ML IJ SOLN
INTRAMUSCULAR | Status: DC | PRN
  Administered 2024-10-07: 50 ug via INTRAVENOUS

## 2024-10-07 MED ORDER — CYCLOPENTOLATE HCL 2 % OP SOLN
1.0000 [drp] | OPHTHALMIC | Status: DC | PRN
  Administered 2024-10-07 (×3): 1 [drp] via OPHTHALMIC

## 2024-10-07 MED ORDER — TETRACAINE HCL 0.5 % OP SOLN
OPHTHALMIC | Status: AC
  Filled 2024-10-07: qty 4

## 2024-10-07 MED ORDER — MIDAZOLAM HCL 2 MG/2ML IJ SOLN
INTRAMUSCULAR | Status: AC
  Filled 2024-10-07: qty 2

## 2024-10-07 MED ORDER — SIGHTPATH DOSE#1 BSS IO SOLN
INTRAOCULAR | Status: DC | PRN
  Administered 2024-10-07: 15 mL via INTRAOCULAR

## 2024-10-07 MED ORDER — PHENYLEPHRINE HCL 10 % OP SOLN
1.0000 [drp] | OPHTHALMIC | Status: DC | PRN
  Administered 2024-10-07 (×3): 1 [drp] via OPHTHALMIC

## 2024-10-07 MED ORDER — CYCLOPENTOLATE HCL 2 % OP SOLN
OPHTHALMIC | Status: AC
  Filled 2024-10-07: qty 2

## 2024-10-07 MED ORDER — MOXIFLOXACIN HCL 0.5 % OP SOLN
OPHTHALMIC | Status: DC | PRN
  Administered 2024-10-07: .2 mL via OPHTHALMIC

## 2024-10-07 SURGICAL SUPPLY — 9 items
DISSECTOR HYDRO NUCLEUS 50X22 (MISCELLANEOUS) ×1 IMPLANT
DRSG TEGADERM 2-3/8X2-3/4 SM (GAUZE/BANDAGES/DRESSINGS) ×1 IMPLANT
FEE CATARACT SUITE SIGHTPATH (MISCELLANEOUS) ×1 IMPLANT
GLOVE BIOGEL PI IND STRL 8 (GLOVE) ×1 IMPLANT
GLOVE SURG LX STRL 7.5 STRW (GLOVE) ×1 IMPLANT
GLOVE SURG SYN 6.5 PF PI BL (GLOVE) ×1 IMPLANT
LENS IOL TECNIS MONO 21.0 (Intraocular Lens) IMPLANT
NDL FILTER BLUNT 18X1 1/2 (NEEDLE) ×1 IMPLANT
SYR 3ML LL SCALE MARK (SYRINGE) ×1 IMPLANT

## 2024-10-07 NOTE — Anesthesia Postprocedure Evaluation (Signed)
 Anesthesia Post Note  Patient: Kristin Cherry  Procedure(s) Performed: PHACOEMULSIFICATION, CATARACT, WITH IOL  4.30 00:31.2 (Right: Eye)  Patient location during evaluation: PACU Anesthesia Type: MAC Level of consciousness: awake and alert Pain management: pain level controlled Vital Signs Assessment: post-procedure vital signs reviewed and stable Respiratory status: spontaneous breathing, nonlabored ventilation, respiratory function stable and patient connected to nasal cannula oxygen Cardiovascular status: stable and blood pressure returned to baseline Postop Assessment: no apparent nausea or vomiting Anesthetic complications: no   No notable events documented.   Last Vitals:  Vitals:   10/07/24 1038 10/07/24 1043  BP: 126/79 128/75  Pulse: 65 69  Resp:  13  Temp: (!) 36.2 C   SpO2: 98% 99%    Last Pain:  Vitals:   10/07/24 1043  TempSrc:   PainSc: 0-No pain                 Ireland Chagnon C Jamerson Vonbargen

## 2024-10-07 NOTE — Transfer of Care (Signed)
 Immediate Anesthesia Transfer of Care Note  Patient: Kristin Cherry  Procedure(s) Performed: PHACOEMULSIFICATION, CATARACT, WITH IOL  4.30 00:31.2 (Right: Eye)  Patient Location: PACU  Anesthesia Type: MAC  Level of Consciousness: awake, alert  and patient cooperative  Airway and Oxygen Therapy: Patient Spontanous Breathing and Patient connected to supplemental oxygen  Post-op Assessment: Post-op Vital signs reviewed, Patient's Cardiovascular Status Stable, Respiratory Function Stable, Patent Airway and No signs of Nausea or vomiting  Post-op Vital Signs: Reviewed and stable  Complications: No notable events documented.

## 2024-10-07 NOTE — Op Note (Signed)
 OPERATIVE NOTE  MYLIN GIGNAC [id number] 10/07/2024   PREOPERATIVE DIAGNOSIS: Nuclear sclerotic cataract right eye. H25.11   POSTOPERATIVE DIAGNOSIS: Nuclear sclerotic cataract right eye. H25.11   PROCEDURE:  Phacoemulsification with posterior chamber intraocular lens placement of the right eye  Ultrasound time: Procedure(s): PHACOEMULSIFICATION, CATARACT, WITH IOL  4.30 00:31.2 (Right)  LENS:   Implant Name Type Inv. Item Serial No. Manufacturer Lot No. LRB No. Used Action  LENS IOL TECNIS MONO 21.0 - [id number] Intraocular Lens LENS IOL TECNIS MONO 21.0 [id number] [facility]  Right 1 Implanted      SURGEON:  Feliciano CHRISTELLA. Enola, MD   ANESTHESIA:  Topical with tetracaine drops, augmented with 1% preservative-free intracameral lidocaine .   COMPLICATIONS:  None.   DESCRIPTION OF PROCEDURE:  The patient was identified in the holding room and transported to the operating room and placed in the supine position under the operating microscope.  The right eye was identified as the operative eye, which was prepped and draped in the usual sterile ophthalmic fashion.   A 1 millimeter clear-corneal paracentesis was made superotemporally. Preservative-free 1% lidocaine  mixed with 1:1,000 bisulfite-free aqueous solution of epinephrine  was injected into the anterior chamber. The anterior chamber was then filled with Viscoat viscoelastic. A 2.4 millimeter keratome was used to make a clear-corneal incision inferotemporally. A curvilinear capsulorrhexis was made with a cystotome and capsulorrhexis forceps. Balanced salt solution was used to hydrodissect and hydrodelineate the nucleus. Phacoemulsification was then used to remove the lens nucleus and epinucleus. The remaining cortex was then removed using the irrigation and aspiration handpiece. Provisc was then placed into the capsular bag to distend it for lens placement. A +21.00 D DCB00 intraocular lens was then injected into the capsular bag. The  remaining viscoelastic was aspirated.   Wounds were hydrated with balanced salt solution.  The anterior chamber was inflated to a physiologic pressure with balanced salt solution.  No wound leaks were noted. Moxifloxacin was injected intracamerally.  Timolol and Brimonidine drops were applied to the eye.  The patient was taken to the recovery room in stable condition without complications of anesthesia or surgery.  Feliciano Hugger [place] 10/07/2024, 10:36 AM

## 2024-10-07 NOTE — H&P (Signed)
 [facility]   Primary Care Physician:  Gretel App, NP Ophthalmologist: Dr. Feliciano Ober  Pre-Procedure History & Physical: HPI:  Kristin Cherry is a 58 y.o. female here for cataract surgery.   Past Medical History:  Diagnosis Date   Arthritis    in right knee   Asthma    not since 2014   Diabetes mellitus without complication (HCC)    GERD (gastroesophageal reflux disease)    History of bronchitis    Hyperlipidemia    Hypertension    Obesity     Past Surgical History:  Procedure Laterality Date   BREAST BIOPSY Right 11/01/2020   U/S bx, ribbon marker, path pending   CHOLECYSTECTOMY     COLONOSCOPY N/A 04/05/2022   Procedure: COLONOSCOPY;  Surgeon: Maryruth Ole DASEN, MD;  Location: [facility] ENDOSCOPY;  Service: Endoscopy;  Laterality: N/A;  DM   COLONOSCOPY WITH PROPOFOL  N/A 12/26/2017   Procedure: COLONOSCOPY WITH PROPOFOL ;  Surgeon: Viktoria Lamar DASEN, MD;  Location: [facility] ENDOSCOPY;  Service: Endoscopy;  Laterality: N/A;   ESOPHAGOGASTRODUODENOSCOPY N/A 04/05/2022   Procedure: ESOPHAGOGASTRODUODENOSCOPY (EGD);  Surgeon: Maryruth Ole DASEN, MD;  Location: [facility] ENDOSCOPY;  Service: Endoscopy;  Laterality: N/A;   KNEE ARTHROSCOPY Right 10/02/2022   Procedure: RIGHT KNEE ARTHROSCOPY WITH DEBRIDEMENT, PARTIAL MEDIAL AND LATERAL MENISCECTOMIES, AND REINJECTION OF FAT CELLS.;  Surgeon: Edie Norleen PARAS, MD;  Location: [facility] ORS;  Service: Orthopedics;  Laterality: Right;   KNEE CARTILAGE SURGERY Left 2000   KNEE SURGERY     slow the growth of right knee patient reports in 1979 and left knee cartilage repair in 2000   SALPINGOOPHORECTOMY Right     Prior to Admission medications   Medication Sig Start Date End Date Taking? Authorizing Provider  albuterol  (VENTOLIN  HFA) 108 (90 Base) MCG/ACT inhaler Inhale 2 puffs into the lungs every 6 (six) hours as needed for wheezing or shortness of breath. Patient not taking: Reported on 09/24/2024 02/28/23   Gretel App, NP  Continuous  Glucose Sensor (DEXCOM G7 SENSOR) MISC Change every 10 days 10/05/24   Kaur, Charanpreet, NP  FARXIGA  10 MG TABS tablet Take 1 tablet (10 mg total) by mouth every morning. 09/30/24   Kaur, Charanpreet, NP  insulin glargine  (LANTUS  SOLOSTAR) 100 UNIT/ML Solostar Pen Inject 10 Units into the skin at bedtime. 09/26/24   Kaur, Charanpreet, NP  Insulin Pen Needle (PEN NEEDLES) 32G X 6 MM MISC Use to inject 10 units of Lantus  into the skin at bedtime. 09/28/24   Kaur, Charanpreet, NP  rosuvastatin  (CRESTOR ) 10 MG tablet Take 1 tablet (10 mg total) by mouth at bedtime. 09/26/24   Kaur, Charanpreet, NP  Semaglutide ,0.25 or 0.5MG /DOS, 2 MG/3ML SOPN Inject 0.5 mg into the skin once a week. 09/30/24   Kaur, Charanpreet, NP  metFORMIN (GLUCOPHAGE) 1000 MG tablet Take 1,000 mg by mouth 2 (two) times daily with a meal.  03/04/20  [provider]    Allergies as of 08/18/2024 - Review Complete 02/28/2023  Allergen Reaction Noted   Penicillins Hives and Other (See Comments) 01/19/2016   Lodine [etodolac] Hives 01/19/2016   Penicillin g  02/08/2023    Family History  Problem Relation Age of Onset   Cancer Mother    Hypertension Mother    Hypertension Father    Cancer - Other Father    Congestive Heart Failure Father    Cancer Sister    Diabetes Sister    Hypertension Sister    Cancer Brother    Hypertension Brother  Diabetes Maternal Grandmother    Congestive Heart Failure Paternal Grandmother    Hypertension Paternal Grandfather    Diabetes Sister     Social History   Socioeconomic History   Marital status: Single    Spouse name: Not on file   Number of children: Not on file   Years of education: Not on file   Highest education level: Not on file  Occupational History   Not on file  Tobacco Use   Smoking status: Every Day    Current packs/day: 0.25    Average packs/day: 0.6 packs/day for 83.9 years (51.7 ttl pk-yrs)    Types: E-cigarettes, Cigarettes    Start date: 11/04/1981    Smokeless tobacco: Never   Tobacco comments:    Reduced from 1 pack(20) a day to less than half pack(6) a day    Vaping Use   Vaping status: Every Day  Substance and Sexual Activity   Alcohol use: Yes    Comment: rarely   Drug use: Yes    Types: Cocaine, Marijuana    Comment: 35 years ago   Sexual activity: Yes    Birth control/protection: Post-menopausal  Other Topics Concern   Not on file  Social History Narrative   Not on file   Social Drivers of Health   Financial Resource Strain: Low Risk  (10/01/2023)   Received from [facility]   Overall Financial Resource Strain (CARDIA)    Difficulty of Paying Living Expenses: Not very hard  Food Insecurity: Food Insecurity Present (10/01/2023)   Received from [facility]   Hunger Vital Sign    Within the past 12 months, you worried that your food would run out before you got the money to buy more.: Sometimes true    Within the past 12 months, the food you bought just didn't last and you didn't have money to get more.: Sometimes true  Transportation Needs: No Transportation Needs (10/01/2023)   Received from [facility] Transportation    In the past 12 months, has lack of transportation kept you from medical appointments or from getting medications?: No    Lack of Transportation (Non-Medical): No  Physical Activity: Not on file  Stress: Not on file  Social Connections: Not on file  Intimate Partner Violence: Not on file    Review of Systems: See HPI, otherwise negative ROS  Physical Exam: Ht 5' 6 (1.676 m)   Wt 88.2 kg   LMP 10/02/2018 (Exact Date)   BMI 31.38 kg/m  General:   Alert, cooperative in NAD Head:  Normocephalic and atraumatic. Respiratory:  Normal work of breathing. Cardiovascular:  RRR  Impression/Plan: Kristin Cherry is here for cataract surgery.  Risks, benefits, limitations, and alternatives regarding cataract surgery have been  reviewed with the patient.  Questions have been answered.  All parties agreeable.   Feliciano Bryan Ober, MD  10/07/2024, 7:14 AM

## 2024-10-08 ENCOUNTER — Encounter: Payer: Self-pay | Admitting: Ophthalmology

## 2024-10-08 NOTE — Telephone Encounter (Signed)
 Pharmacy Patient Advocate Encounter  Received notification from [employer] that Prior Authorization for Dexcom G7 Sensor  has been DENIED.  Full denial letter will be uploaded to the media tab. See denial reason below.     PA #/Case ID/Reference #: [id number]

## 2024-10-08 NOTE — Telephone Encounter (Signed)
 Has anyone heard anything about the PA for the Dexcom G7 CGM for this Patient?

## 2024-10-12 NOTE — Telephone Encounter (Signed)
 Patient is aware that her insurance denied the PA for the Dexcom G7 and wants to know what the next steps are?

## 2024-10-12 NOTE — Telephone Encounter (Signed)
 Can we please appeal for the dexcom for this pt. It was denied.  Pt in on latus with HgA1c >15

## 2024-10-12 NOTE — Discharge Instructions (Signed)

## 2024-10-13 NOTE — Telephone Encounter (Signed)
 Can we please appeal for the denial for CGM for this patient. She is on insulin lantus   with A1c of 15.5

## 2024-10-14 ENCOUNTER — Ambulatory Visit

## 2024-10-14 ENCOUNTER — Encounter: Payer: Self-pay | Admitting: Ophthalmology

## 2024-10-14 ENCOUNTER — Other Ambulatory Visit: Payer: Self-pay

## 2024-10-14 ENCOUNTER — Ambulatory Visit
Admission: RE | Admit: 2024-10-14 | Discharge: 2024-10-14 | Disposition: A | Attending: Ophthalmology | Admitting: Ophthalmology

## 2024-10-14 ENCOUNTER — Encounter
Admission: RE | Disposition: A | Discharge status: Home / Self Care | Payer: Self-pay | Source: Home / Self Care | Attending: Ophthalmology

## 2024-10-14 DIAGNOSIS — E669 Obesity, unspecified: Secondary | ICD-10-CM | POA: Diagnosis not present

## 2024-10-14 DIAGNOSIS — K219 Gastro-esophageal reflux disease without esophagitis: Secondary | ICD-10-CM | POA: Diagnosis not present

## 2024-10-14 DIAGNOSIS — H2512 Age-related nuclear cataract, left eye: Secondary | ICD-10-CM | POA: Diagnosis not present

## 2024-10-14 DIAGNOSIS — Z6830 Body mass index (BMI) 30.0-30.9, adult: Secondary | ICD-10-CM | POA: Diagnosis not present

## 2024-10-14 DIAGNOSIS — I10 Essential (primary) hypertension: Secondary | ICD-10-CM | POA: Diagnosis not present

## 2024-10-14 DIAGNOSIS — E785 Hyperlipidemia, unspecified: Secondary | ICD-10-CM | POA: Diagnosis not present

## 2024-10-14 DIAGNOSIS — J45909 Unspecified asthma, uncomplicated: Secondary | ICD-10-CM | POA: Diagnosis not present

## 2024-10-14 DIAGNOSIS — Z833 Family history of diabetes mellitus: Secondary | ICD-10-CM | POA: Diagnosis not present

## 2024-10-14 DIAGNOSIS — M1990 Unspecified osteoarthritis, unspecified site: Secondary | ICD-10-CM | POA: Diagnosis not present

## 2024-10-14 DIAGNOSIS — Z794 Long term (current) use of insulin: Secondary | ICD-10-CM | POA: Diagnosis not present

## 2024-10-14 DIAGNOSIS — E1136 Type 2 diabetes mellitus with diabetic cataract: Secondary | ICD-10-CM | POA: Diagnosis not present

## 2024-10-14 DIAGNOSIS — F17210 Nicotine dependence, cigarettes, uncomplicated: Secondary | ICD-10-CM | POA: Diagnosis not present

## 2024-10-14 DIAGNOSIS — F17290 Nicotine dependence, other tobacco product, uncomplicated: Secondary | ICD-10-CM | POA: Diagnosis not present

## 2024-10-14 HISTORY — PX: CATARACT EXTRACTION W/PHACO: SHX586

## 2024-10-14 LAB — GLUCOSE, CAPILLARY: Glucose-Capillary: 279 mg/dL — ABNORMAL HIGH (ref 70–99)

## 2024-10-14 SURGERY — PHACOEMULSIFICATION, CATARACT, WITH IOL INSERTION
Anesthesia: Topical | Laterality: Left

## 2024-10-14 MED ORDER — MOXIFLOXACIN HCL 0.5 % OP SOLN
OPHTHALMIC | Status: DC | PRN
  Administered 2024-10-14: .2 mL via OPHTHALMIC

## 2024-10-14 MED ORDER — TETRACAINE HCL 0.5 % OP SOLN
1.0000 [drp] | OPHTHALMIC | Status: DC | PRN
  Administered 2024-10-14 (×3): 1 [drp] via OPHTHALMIC

## 2024-10-14 MED ORDER — MIDAZOLAM HCL (PF) 2 MG/2ML IJ SOLN
INTRAMUSCULAR | Status: DC | PRN
  Administered 2024-10-14: 2 mg via INTRAVENOUS

## 2024-10-14 MED ORDER — CYCLOPENTOLATE HCL 2 % OP SOLN
1.0000 [drp] | OPHTHALMIC | Status: AC
  Administered 2024-10-14 (×3): 1 [drp] via OPHTHALMIC

## 2024-10-14 MED ORDER — LIDOCAINE HCL (PF) 2 % IJ SOLN
INTRAOCULAR | Status: DC | PRN
  Administered 2024-10-14: 1 mL via INTRAOCULAR

## 2024-10-14 MED ORDER — BRIMONIDINE TARTRATE-TIMOLOL 0.2-0.5 % OP SOLN
OPHTHALMIC | Status: DC | PRN
  Administered 2024-10-14: 1 [drp] via OPHTHALMIC

## 2024-10-14 MED ORDER — TETRACAINE HCL 0.5 % OP SOLN
OPHTHALMIC | Status: AC
  Filled 2024-10-14: qty 4

## 2024-10-14 MED ORDER — SIGHTPATH DOSE#1 BSS IO SOLN
INTRAOCULAR | Status: DC | PRN
  Administered 2024-10-14: 61 mL via OPHTHALMIC

## 2024-10-14 MED ORDER — SIGHTPATH DOSE#1 NA HYALUR & NA CHOND-NA HYALUR IO KIT
PACK | INTRAOCULAR | Status: DC | PRN
  Administered 2024-10-14: 1 via OPHTHALMIC

## 2024-10-14 MED ORDER — PHENYLEPHRINE HCL 10 % OP SOLN
OPHTHALMIC | Status: AC
  Filled 2024-10-14: qty 5

## 2024-10-14 MED ORDER — FENTANYL CITRATE (PF) 100 MCG/2ML IJ SOLN
INTRAMUSCULAR | Status: DC | PRN
  Administered 2024-10-14: 50 ug via INTRAVENOUS

## 2024-10-14 MED ORDER — SIGHTPATH DOSE#1 BSS IO SOLN
INTRAOCULAR | Status: DC | PRN
  Administered 2024-10-14: 15 mL via INTRAOCULAR

## 2024-10-14 MED ORDER — MIDAZOLAM HCL 2 MG/2ML IJ SOLN
INTRAMUSCULAR | Status: AC
  Filled 2024-10-14: qty 2

## 2024-10-14 MED ORDER — PHENYLEPHRINE HCL 10 % OP SOLN
1.0000 [drp] | OPHTHALMIC | Status: AC
  Administered 2024-10-14 (×3): 1 [drp] via OPHTHALMIC

## 2024-10-14 MED ORDER — CYCLOPENTOLATE HCL 2 % OP SOLN
OPHTHALMIC | Status: AC
  Filled 2024-10-14: qty 2

## 2024-10-14 MED ORDER — FENTANYL CITRATE (PF) 100 MCG/2ML IJ SOLN
INTRAMUSCULAR | Status: AC
  Filled 2024-10-14: qty 2

## 2024-10-14 SURGICAL SUPPLY — 9 items
DISSECTOR HYDRO NUCLEUS 50X22 (MISCELLANEOUS) ×1 IMPLANT
DRSG TEGADERM 2-3/8X2-3/4 SM (GAUZE/BANDAGES/DRESSINGS) ×1 IMPLANT
FEE CATARACT SUITE SIGHTPATH (MISCELLANEOUS) ×1 IMPLANT
GLOVE BIOGEL PI IND STRL 8 (GLOVE) ×1 IMPLANT
GLOVE SURG LX STRL 7.5 STRW (GLOVE) ×1 IMPLANT
GLOVE SURG SYN 6.5 PF PI BL (GLOVE) ×1 IMPLANT
LENS IOL TECNIS MONO 21.5 (Intraocular Lens) IMPLANT
NDL FILTER BLUNT 18X1 1/2 (NEEDLE) ×1 IMPLANT
SYR 3ML LL SCALE MARK (SYRINGE) ×1 IMPLANT

## 2024-10-14 NOTE — Anesthesia Postprocedure Evaluation (Signed)
 Anesthesia Post Note  Patient: Kristin Cherry  Procedure(s) Performed: PHACOEMULSIFICATION, CATARACT, WITH IOL INSERTION 5.72, 00:40.8 (Left)  Patient location during evaluation: PACU Anesthesia Type: MAC Level of consciousness: awake and alert Pain management: pain level controlled Vital Signs Assessment: post-procedure vital signs reviewed and stable Respiratory status: spontaneous breathing, nonlabored ventilation, respiratory function stable and patient connected to nasal cannula oxygen Cardiovascular status: stable and blood pressure returned to baseline Postop Assessment: no apparent nausea or vomiting Anesthetic complications: no   No notable events documented.   Last Vitals:  Vitals:   10/14/24 0835 10/14/24 0840  BP: 93/74 92/72  Pulse: 68 68  Resp: (!) 9 16  Temp:  (!) 36.1 C  SpO2: 96% 96%    Last Pain:  Vitals:   10/14/24 0840  TempSrc:   PainSc: 0-No pain                 [name]

## 2024-10-14 NOTE — Transfer of Care (Signed)
 Immediate Anesthesia Transfer of Care Note  Patient: Kristin Cherry  Procedure(s) Performed: PHACOEMULSIFICATION, CATARACT, WITH IOL INSERTION 5.72, 00:40.8 (Left)  Patient Location: PACU  Anesthesia Type: MAC  Level of Consciousness: awake, alert  and patient cooperative  Airway and Oxygen Therapy: Patient Spontanous Breathing and Patient connected to supplemental oxygen  Post-op Assessment: Post-op Vital signs reviewed, Patient's Cardiovascular Status Stable, Respiratory Function Stable, Patent Airway and No signs of Nausea or vomiting  Post-op Vital Signs: Reviewed and stable  Complications: No notable events documented.

## 2024-10-14 NOTE — Op Note (Signed)
 OPERATIVE NOTE  Kristin Cherry [id number] 10/14/2024   PREOPERATIVE DIAGNOSIS: Nuclear sclerotic cataract left eye. H25.12   POSTOPERATIVE DIAGNOSIS: Nuclear sclerotic cataract left eye. H25.12   PROCEDURE:  Phacoemulsification with posterior chamber intraocular lens placement of the left eye  Ultrasound time: Procedures: PHACOEMULSIFICATION, CATARACT, WITH IOL INSERTION 5.72, 00:40.8 (Left)  LENS:   Implant Name Type Inv. Item Serial No. Manufacturer Lot No. LRB No. Used Action  LENS IOL TECNIS MONO 21.5 - [id number] Intraocular Lens LENS IOL TECNIS MONO 21.5 [id number] [employer]  Left 1 Implanted      SURGEON:  Feliciano CHRISTELLA. [place], MD   ANESTHESIA:  Topical with tetracaine  drops, augmented with 1% preservative-free intracameral lidocaine .   COMPLICATIONS:  None.   DESCRIPTION OF PROCEDURE:  The patient was identified in the holding room and transported to the operating room and placed in the supine position under the operating microscope.  The left eye was identified as the operative eye, which was prepped and draped in the usual sterile ophthalmic fashion.   A 1 millimeter clear-corneal paracentesis was made inferotemporally. Preservative-free 1% lidocaine  mixed with 1:1,000 bisulfite-free aqueous solution of epinephrine  was injected into the anterior chamber. The anterior chamber was then filled with Viscoat viscoelastic. A 2.4 millimeter keratome was used to make a clear-corneal incision superotemporally. A curvilinear capsulorrhexis was made with a cystotome and capsulorrhexis forceps. Balanced salt  solution was used to hydrodissect and hydrodelineate the nucleus. Phacoemulsification was then used to remove the lens nucleus and epinucleus. The remaining cortex was then removed using the irrigation and aspiration handpiece. Provisc was then placed into the capsular bag to distend it for lens placement. A +21.50 D [id number] intraocular lens was then injected into the capsular bag. The  remaining viscoelastic was aspirated.   Wounds were hydrated with balanced salt  solution.  The anterior chamber was inflated to a physiologic pressure with balanced salt  solution.  No wound leaks were noted. Moxifloxacin  was injected intracamerally.  Timolol  and Brimonidine  drops were applied to the eye.  The patient was taken to the recovery room in stable condition without complications of anesthesia or surgery.  Feliciano Hugger [place] 10/14/2024, 8:33 AM

## 2024-10-14 NOTE — H&P (Signed)
 [facility]   Primary Care Physician:  Gretel App, NP Ophthalmologist: Dr. Feliciano Ober  Pre-Procedure History & Physical: HPI:  Kristin Cherry is a 58 y.o. female here for cataract surgery.   Past Medical History:  Diagnosis Date   Arthritis    in right knee   Asthma    not since 2014   Diabetes mellitus without complication (HCC)    GERD (gastroesophageal reflux disease)    History of bronchitis    Hyperlipidemia    Hypertension    Obesity     Past Surgical History:  Procedure Laterality Date   BREAST BIOPSY Right 11/01/2020   U/S bx, ribbon marker, path pending   CATARACT EXTRACTION W/PHACO Right 10/07/2024   Procedure: PHACOEMULSIFICATION, CATARACT, WITH IOL  4.30 00:31.2;  Surgeon: Ober Feliciano Hugger, MD;  Location: [facility];  Service: Ophthalmology;  Laterality: Right;   CHOLECYSTECTOMY     COLONOSCOPY N/A 04/05/2022   Procedure: COLONOSCOPY;  Surgeon: Maryruth Ole DASEN, MD;  Location: [facility] ENDOSCOPY;  Service: Endoscopy;  Laterality: N/A;  DM   COLONOSCOPY WITH PROPOFOL  N/A 12/26/2017   Procedure: COLONOSCOPY WITH PROPOFOL ;  Surgeon: Viktoria Lamar DASEN, MD;  Location: [facility] ENDOSCOPY;  Service: Endoscopy;  Laterality: N/A;   ESOPHAGOGASTRODUODENOSCOPY N/A 04/05/2022   Procedure: ESOPHAGOGASTRODUODENOSCOPY (EGD);  Surgeon: Maryruth Ole DASEN, MD;  Location[facility];  Service: Endoscopy;  Laterality: N/A;   KNEE ARTHROSCOPY Right 10/02/2022   Procedure: RIGHT KNEE ARTHROSCOPY WITH DEBRIDEMENT, PARTIAL MEDIAL AND LATERAL MENISCECTOMIES, AND REINJECTION OF FAT CELLS.;  Surgeon: Edie Norleen PARAS, MD;  Location: [facility] ORS;  Service: Orthopedics;  Laterality: Right;   KNEE CARTILAGE SURGERY Left 2000   KNEE SURGERY     slow the growth of right knee patient reports in 1979 and left knee cartilage repair in 2000   SALPINGOOPHORECTOMY Right     Prior to Admission medications  Medication Sig Start Date End Date Taking? Authorizing Provider   albuterol  (VENTOLIN  HFA) 108 (90 Base) MCG/ACT inhaler Inhale 2 puffs into the lungs every 6 (six) hours as needed for wheezing or shortness of breath. 02/28/23  Yes Gretel App, NP  Continuous Glucose Sensor (DEXCOM G7 SENSOR) MISC Change every 10 days 10/05/24  Yes Vincente Saber, NP  FARXIGA  10 MG TABS tablet Take 1 tablet (10 mg total) by mouth every morning. 09/30/24  Yes Kaur, Charanpreet, NP  insulin glargine  (LANTUS  SOLOSTAR) 100 UNIT/ML Solostar Pen Inject 10 Units into the skin at bedtime. 09/26/24  Yes Kaur, Charanpreet, NP  Insulin Pen Needle (PEN NEEDLES) 32G X 6 MM MISC Use to inject 10 units of Lantus  into the skin at bedtime. 09/28/24  Yes Vincente Saber, NP  rosuvastatin  (CRESTOR ) 10 MG tablet Take 1 tablet (10 mg total) by mouth at bedtime. 09/26/24  Yes Vincente Saber, NP  Semaglutide ,0.25 or 0.5MG /DOS, 2 MG/3ML SOPN Inject 0.5 mg into the skin once a week. 09/30/24  Yes Kaur, Charanpreet, NP  metFORMIN (GLUCOPHAGE) 1000 MG tablet Take 1,000 mg by mouth 2 (two) times daily with a meal.  03/04/20  [provider]    Allergies as of 09/16/2024 - Review Complete 02/28/2023  Allergen Reaction Noted   Penicillins Hives and Other (See Comments) 01/19/2016   Lodine [etodolac] Hives 01/19/2016   Penicillin g  02/08/2023    Family History  Problem Relation Age of Onset   Cancer Mother    Hypertension Mother    Hypertension Father    Cancer - Other Father    Congestive Heart  Failure Father    Cancer Sister    Diabetes Sister    Hypertension Sister    Cancer Brother    Hypertension Brother    Diabetes Maternal Grandmother    Congestive Heart Failure Paternal Grandmother    Hypertension Paternal Grandfather    Diabetes Sister     Social History   Socioeconomic History   Marital status: Single    Spouse name: Not on file   Number of children: Not on file   Years of education: Not on file   Highest education level: Not on file  Occupational History    Not on file  Tobacco Use   Smoking status: Every Day    Current packs/day: 0.25    Average packs/day: 0.6 packs/day for 83.9 years (51.7 ttl pk-yrs)    Types: E-cigarettes, Cigarettes    Start date: 11/04/1981   Smokeless tobacco: Never   Tobacco comments:    Reduced from 1 pack(20) a day to less than half pack(6) a day    Vaping Use   Vaping status: Every Day  Substance and Sexual Activity   Alcohol use: Yes    Comment: rarely   Drug use: Yes    Types: Cocaine, Marijuana    Comment: 35 years ago   Sexual activity: Yes    Birth control/protection: Post-menopausal  Other Topics Concern   Not on file  Social History Narrative   Not on file   Social Drivers of Health   Tobacco Use: High Risk (10/07/2024)   Patient History    Smoking Tobacco Use: Every Day    Smokeless Tobacco Use: Never    Passive Exposure: Not on file  Financial Resource Strain: Low Risk  (10/01/2023)   Received from [facility]   Overall Financial Resource Strain (CARDIA)    Difficulty of Paying Living Expenses: Not very hard  Food Insecurity: Food Insecurity Present (10/01/2023)   Received from [facility]   Epic    Within the past 12 months, you worried that your food would run out before you got the money to buy more.: Sometimes true    Within the past 12 months, the food you bought just didn't last and you didn't have money to get more.: Sometimes true  Transportation Needs: No Transportation Needs (10/01/2023)   Received from [facility] - Transportation    In the past 12 months, has lack of transportation kept you from medical appointments or from getting medications?: No    Lack of Transportation (Non-Medical): No  Physical Activity: Not on file  Stress: Not on file  Social Connections: Not on file  Intimate Partner Violence: Not on file  Depression (PHQ2-9): Low Risk (09/24/2024)   Depression (PHQ2-9)    PHQ-2 Score: 1  Alcohol  Screen: Not on file  Housing: Low Risk  (10/01/2023)   Received from [facility]   Epic    In the last 12 months, was there a time when you were not able to pay the mortgage or rent on time?: No    In the past 12 months, how many times have you moved where you were living?: 0    At any time in the past 12 months, were you homeless or living in a shelter (including now)?: No  Utilities: At Risk (10/01/2023)   Received from [facility] Utilities    Threatened with loss of utilities: Yes  Health Literacy: Not  on file    Review of Systems: See HPI, otherwise negative ROS  Physical Exam: BP 119/76   Pulse 73   Temp (!) 97.5 F (36.4 C) (Temporal)   Resp 12   Wt 85.7 kg   LMP 10/02/2018   SpO2 96%   BMI 30.52 kg/m  General:   Alert, cooperative in NAD Head:  Normocephalic and atraumatic. Respiratory:  Normal work of breathing. Cardiovascular:  RRR  Impression/Plan: Kristin Cherry is here for cataract surgery.  Risks, benefits, limitations, and alternatives regarding cataract surgery have been reviewed with the patient.  Questions have been answered.  All parties agreeable.   Feliciano Bryan Ober, MD  10/14/2024, 7:14 AM

## 2024-10-14 NOTE — Anesthesia Preprocedure Evaluation (Signed)
 Anesthesia Evaluation  Patient identified by MRN, date of birth, ID band Patient awake    Reviewed: Allergy & Precautions, H&P , NPO status , Patient's Chart, lab work & pertinent test results  History of Anesthesia Complications Negative for: history of anesthetic complications  Airway Mallampati: III  TM Distance: >3 FB     Dental  (+) Upper Dentures   Pulmonary neg pulmonary ROS, asthma , Current Smoker          Cardiovascular hypertension, negative cardio ROS      Neuro/Psych  Neuromuscular disease negative neurological ROS  negative psych ROS   GI/Hepatic negative GI ROS, Neg liver ROS,GERD  ,,  Endo/Other  negative endocrine ROSdiabetes    Renal/GU negative Renal ROS  negative genitourinary   Musculoskeletal negative musculoskeletal ROS (+) Arthritis ,    Abdominal   Peds negative pediatric ROS (+)  Hematology negative hematology ROS (+)   Anesthesia Other Findings Last ozempic  10-02-24 Wears wig, but it is on very nicely, just use caution when placing cap for surgery  HTN Asthma Arthritis Obesity BMI 31.38 Hx bronchitis GERD Diabetes mellitus HTN Hyperlipidemia   Reproductive/Obstetrics negative OB ROS                              Anesthesia Physical Anesthesia Plan  ASA: 3  Anesthesia Plan: MAC   Post-op Pain Management:    Induction: Intravenous  PONV Risk Score and Plan:   Airway Management Planned: Natural Airway and Nasal Cannula  Additional Equipment:   Intra-op Plan:   Post-operative Plan:   Informed Consent: I have reviewed the patients History and Physical, chart, labs and discussed the procedure including the risks, benefits and alternatives for the proposed anesthesia with the patient or authorized representative who has indicated his/her understanding and acceptance.     Dental Advisory Given  Plan Discussed with: Anesthesiologist, CRNA  and Surgeon  Anesthesia Plan Comments: (Patient consented for risks of anesthesia including but not limited to:  - adverse reactions to medications - damage to eyes, teeth, lips or other oral mucosa - nerve damage due to positioning  - sore throat or hoarseness - Damage to heart, brain, nerves, lungs, other parts of body or loss of life  Patient voiced understanding and assent.)         Anesthesia Quick Evaluation

## 2024-10-15 NOTE — Telephone Encounter (Signed)
 Has anyone heard anything about this Patient's Dexcom appeal?

## 2024-10-19 ENCOUNTER — Ambulatory Visit: Admitting: Nurse Practitioner

## 2024-10-19 NOTE — Telephone Encounter (Signed)
 Patient is requesting an update on the Dexcom and I do not see any updated notes in her chart.

## 2024-10-20 ENCOUNTER — Ambulatory Visit: Admitting: Internal Medicine

## 2024-10-20 ENCOUNTER — Encounter: Payer: Self-pay | Admitting: Internal Medicine

## 2024-10-20 VITALS — BP 116/80 | HR 66 | Temp 98.9°F | Ht 60.0 in | Wt 190.6 lb

## 2024-10-20 DIAGNOSIS — E1142 Type 2 diabetes mellitus with diabetic polyneuropathy: Secondary | ICD-10-CM

## 2024-10-20 DIAGNOSIS — Z7985 Long-term (current) use of injectable non-insulin antidiabetic drugs: Secondary | ICD-10-CM | POA: Diagnosis not present

## 2024-10-20 DIAGNOSIS — E1159 Type 2 diabetes mellitus with other circulatory complications: Secondary | ICD-10-CM

## 2024-10-20 DIAGNOSIS — Z7984 Long term (current) use of oral hypoglycemic drugs: Secondary | ICD-10-CM | POA: Diagnosis not present

## 2024-10-20 DIAGNOSIS — I152 Hypertension secondary to endocrine disorders: Secondary | ICD-10-CM | POA: Diagnosis not present

## 2024-10-20 MED ORDER — LANTUS SOLOSTAR 100 UNIT/ML ~~LOC~~ SOPN: 12.0000 [IU] | PEN_INJECTOR | Freq: Every day | SUBCUTANEOUS | Status: DC

## 2024-10-20 NOTE — Patient Instructions (Addendum)
°  VISIT SUMMARY: Today, we discussed your challenges with managing your blood sugar levels and medication access issues. We reviewed your current medications, recent weight loss, blood pressure improvements, and postoperative status following cataract surgery. We also addressed your chronic knee pain and exercise routine.  YOUR PLAN: -TYPE 2 DIABETES MELLITUS WITH DIABETIC POLYNEUROPATHY: This condition means that your diabetes is not well controlled, leading to high blood sugar levels and nerve damage. We will increase your Lantus  dose to 12 units at night and continue your current medications, Farxiga  and Ozempic , with plans to gradually increase the Ozempic  dose. We will work on resolving the insurance issues for your Dexcom sensors and schedule a follow-up in 4 weeks to monitor your blood sugar control and medication adjustments. A urine protein test will be ordered to check for kidney damage, and if needed, we may start you on a low-dose blood pressure medication.  -GENERAL HEALTH MAINTENANCE: Your recent cataract surgery has improved your vision, and you are doing well with chair exercises for your knee health. Continue with these exercises and consider adding water aerobics for a low-impact workout.  INSTRUCTIONS: Please follow up in 4 weeks to assess your blood sugar control and the titration of Ozempic . Ensure you get the urine protein test done as ordered. We will also work on resolving the insurance authorization for your Dexcom sensors.                      Contains text generated by Abridge.                                 Contains text generated by Abridge.

## 2024-10-20 NOTE — Assessment & Plan Note (Signed)
-   Patient with history of hypertension and was previously on medication -Patient states that she lost approximately 40 pounds and no longer requires antihypertensive medication -Blood pressure today is 116/80 (at goal) -Will continue to monitor off antihypertensives for now -No further workup at this time

## 2024-10-20 NOTE — Assessment & Plan Note (Addendum)
-   This problem is chronic and uncontrolled -Patient noted to have an A1c greater than 15.5 at her last visit with blood glucose in the 500s -Patient started back on her Farxiga  10 mg daily as well as Ozempic  0.25 mg weekly (will transition in 2 weeks to 0.5 mg) -She is also on Lantus  10 units at night -Patient's blood sugars have remained elevated in the 200s to 300s in the morning -Will increase Lantus  to 12 units and increase her Ozempic  to 0.5 mg once weekly in 2 weeks -Will check urine microalbumin to creatinine ratio today -2 Dexcom sample sensors were given to the patient today.  We have had issues obtaining the Dexcom for her and patient requires a peer to peer with her insurance company to get authorization for this -Have patient follow-up in 4 weeks to to see if further medication adjustments are needed and to see if she was able to obtain her Dexcom sensor -No further workup at this time

## 2024-10-20 NOTE — Progress Notes (Signed)
 Acute Office Visit  Subjective:     Patient ID: Kristin Cherry, female    DOB: [birth year], 58 y.o.   MRN: [id number]  Chief Complaint  Patient presents with   2 Week Follow-up    Discussed the use of AI scribe software for clinical note transcription with the patient, who gave verbal consent to proceed.  History of Present Illness Kristin Cherry is a 58 year old female who presents with difficulty managing blood sugar levels and medication access issues.  Hyperglycemia and diabetes management - Diabetic for approximately 20 years, currently experiencing difficulty managing blood glucose levels. - Blood sugar levels consistently elevated, particularly in the mornings, with readings often in the 200s to 300s. - Average blood glucose reading is 288. - A1c recently measured at 15.5, the highest recorded for her. - Feels her diabetes is currently out of control despite efforts to manage condition. - Uses Dexcom continuous glucose monitor, but has not had access to a meter for two days. - Finds Dexcom helpful for tracking blood sugar in relation to diet, but frustrated by cost and insurance issues for sensors.  Medication adherence and access issues - Was off diabetes medications for approximately one year due to access challenges and personal reasons. - Recently resumed medication regimen and working to stabilize blood sugar. - Currently taking Lantus  (10 units), Farxiga  (with discount card), and Ozempic  (recently restarted at 0.25 mg, plans to increase to 0.5 mg). - History of adverse gastrointestinal reactions to metformin; refuses to take metformin again. - Experiencing insurance and cost barriers to obtaining Dexcom sensors.  Weight loss - Lost 40 pounds since March. - Attributes weight loss to dietary changes and dental surgery requiring soft foods.  Blood pressure improvement - Not currently on antihypertensive medication. - Blood pressure has improved  significantly, attributed to Farxiga  and weight loss.  Postoperative status: cataract surgery - Underwent cataract surgery on both eyes, with last surgery on October 14, 2024. - Vision has improved significantly postoperatively. - Requires reading glasses; considering contact lenses with bifocals as an alternative.  Musculoskeletal symptoms - Has chronic knee pain. - Started chair exercises to improve physical health.    Review of Systems  Constitutional: Negative.   HENT: Negative.    Respiratory: Negative.    Cardiovascular: Negative.   Gastrointestinal: Negative.   Musculoskeletal:  Positive for joint pain.  Neurological: Negative.   Psychiatric/Behavioral: Negative.          Objective:    BP 116/80   Pulse 66   Temp 98.9 F (37.2 C)   Ht 5' (1.524 m)   Wt 190 lb 9.6 oz (86.5 kg)   LMP 10/02/2018   SpO2 96%   BMI 37.22 kg/m    Physical Exam Constitutional:      Appearance: Normal appearance.  HENT:     Head: Normocephalic and atraumatic.  Cardiovascular:     Rate and Rhythm: Normal rate and regular rhythm.     Heart sounds: Normal heart sounds.  Pulmonary:     Effort: Pulmonary effort is normal.     Breath sounds: Normal breath sounds. No wheezing, rhonchi or rales.  Abdominal:     General: Bowel sounds are normal. There is no distension.     Palpations: Abdomen is soft.     Tenderness: There is no abdominal tenderness. There is no guarding or rebound.  Musculoskeletal:        General: No swelling or tenderness.     Right lower leg:  No edema.     Left lower leg: No edema.  Neurological:     Mental Status: She is alert.  Psychiatric:        Mood and Affect: Mood normal.        Behavior: Behavior normal.     No results found for any visits on 10/20/24.      Assessment & Plan:   Problem List Items Addressed This Visit       Cardiovascular and Mediastinum   Hypertension associated with diabetes (HCC)   - Patient with history of hypertension  and was previously on medication -Patient states that she lost approximately 40 pounds and no longer requires antihypertensive medication -Blood pressure today is 116/80 (at goal) -Will continue to monitor off antihypertensives for now -No further workup at this time      Relevant Medications   insulin glargine  (LANTUS  SOLOSTAR) 100 UNIT/ML Solostar Pen     Endocrine   Type 2 diabetes mellitus with diabetic polyneuropathy, without long-term current use of insulin (HCC) - Primary   - This problem is chronic and uncontrolled -Patient noted to have an A1c greater than 15.5 at her last visit with blood glucose in the 500s -Patient started back on her Farxiga  10 mg daily as well as Ozempic  0.25 mg weekly (will transition in 2 weeks to 0.5 mg) -She is also on Lantus  10 units at night -Patient's blood sugars have remained elevated in the 200s to 300s in the morning -Will increase Lantus  to 12 units and increase her Ozempic  to 0.5 mg once weekly in 2 weeks -Will check urine microalbumin to creatinine ratio today -2 Dexcom sample sensors were given to the patient today.  We have had issues obtaining the Dexcom for her and patient requires a peer to peer with her insurance company to get authorization for this -Have patient follow-up in 4 weeks to to see if further medication adjustments are needed and to see if she was able to obtain her Dexcom sensor -No further workup at this time      Relevant Medications   insulin glargine  (LANTUS  SOLOSTAR) 100 UNIT/ML Solostar Pen   Other Relevant Orders   Microalbumin / creatinine urine ratio    Meds ordered this encounter  Medications   insulin glargine  (LANTUS  SOLOSTAR) 100 UNIT/ML Solostar Pen    Sig: Inject 12 Units into the skin at bedtime.    No follow-ups on file.  Kristin Meares, MD

## 2024-10-21 ENCOUNTER — Ambulatory Visit: Payer: Self-pay | Admitting: Family

## 2024-10-21 LAB — MICROALBUMIN / CREATININE URINE RATIO
Creatinine,U: 17.4 mg/dL
Microalb Creat Ratio: UNDETERMINED mg/g (ref 0.0–30.0)
Microalb, Ur: <0.7 mg/dL

## 2024-10-25 ENCOUNTER — Other Ambulatory Visit (HOSPITAL_COMMUNITY): Payer: Self-pay

## 2024-10-25 ENCOUNTER — Telehealth: Payer: Self-pay

## 2024-10-25 NOTE — Telephone Encounter (Signed)
 Pharmacy Patient Advocate Encounter   Received notification from Patient Advice Request messages that prior authorization for Dexcom G7 Sensors required/requested.   Insurance verification completed.   The patient is insured through [facility].   Per test claim: PA required; PA submitted to above mentioned insurance via Latent Key/confirmation #/EOC [id number] Status is pending

## 2024-10-26 ENCOUNTER — Other Ambulatory Visit (HOSPITAL_COMMUNITY): Payer: Self-pay

## 2024-10-26 NOTE — Telephone Encounter (Signed)
 Pharmacy Patient Advocate Encounter  Received notification from [facility] that Prior Authorization for Dexcom G7 Sensor has been APPROVED from 09/25/24 to 10/25/25. Ran test claim, Copay is $50. This test claim was processed through [facility]- copay amounts may vary at other pharmacies due to pharmacy/plan contracts, or as the patient moves through the different stages of their insurance plan.   PA #/Case ID/Reference #: [id number]

## 2024-10-26 NOTE — Telephone Encounter (Signed)
 Patient is aware that Dexcom G7 sensor has been approved.

## 2024-10-26 NOTE — Telephone Encounter (Signed)
 Called Patient and she is aware of the Dexcom G7 sensor approval.

## 2024-10-28 ENCOUNTER — Encounter: Payer: Self-pay | Admitting: Nurse Practitioner

## 2024-10-29 ENCOUNTER — Ambulatory Visit: Payer: Self-pay

## 2024-10-29 ENCOUNTER — Ambulatory Visit
Admission: EM | Admit: 2024-10-29 | Discharge: 2024-10-29 | Disposition: A | Discharge status: Home / Self Care | Attending: Emergency Medicine | Admitting: Emergency Medicine

## 2024-10-29 DIAGNOSIS — Z794 Long term (current) use of insulin: Secondary | ICD-10-CM | POA: Diagnosis not present

## 2024-10-29 DIAGNOSIS — R81 Glycosuria: Secondary | ICD-10-CM | POA: Diagnosis not present

## 2024-10-29 DIAGNOSIS — M545 Low back pain, unspecified: Secondary | ICD-10-CM

## 2024-10-29 DIAGNOSIS — E1165 Type 2 diabetes mellitus with hyperglycemia: Secondary | ICD-10-CM | POA: Diagnosis not present

## 2024-10-29 LAB — POCT URINE DIPSTICK
Bilirubin, UA: NEGATIVE
Glucose, UA: >=1000 mg/dL — AB
Ketones, POC UA: NEGATIVE mg/dL
Leukocytes, UA: NEGATIVE
Nitrite, UA: NEGATIVE
Protein Ur, POC: NEGATIVE mg/dL
Spec Grav, UA: 1.01
Urobilinogen, UA: 0.2 U/dL
pH, UA: 5.5

## 2024-10-29 LAB — GLUCOSE, POCT (MANUAL RESULT ENTRY): POCT Glucose (KUC): 204 mg/dL — AB (ref 70–99)

## 2024-10-29 MED ORDER — METHOCARBAMOL 500 MG PO TABS: 500.0000 mg | ORAL_TABLET | Freq: Two times a day (BID) | ORAL | 0 refills | Status: DC | PRN

## 2024-10-29 NOTE — ED Provider Notes (Signed)
 " CAY RALPH PELT    CSN: [id number] Arrival date & time: 10/29/24  1001      History   Chief Complaint Chief Complaint  Patient presents with   Back Pain   Flank Pain    HPI TIM WILHIDE is a 58 y.o. female.  Patient presents with nonradiating right lower back pain x 2 weeks, worse x 2 days.  The pain is worse when she moves or takes deep breath.  No trauma.  No numbness, weakness, paresthesias, saddle anesthesia, loss of bowel/bladder control, fever, chills, cough, shortness of breath, abdominal pain, dysuria, hematuria.  Treatment attempted with Aleve .  Her medical history includes diabetes, diabetic polyneuropathy, hypertension, asthma.  Last A1c >15.5 on 09/24/2024.  The history is provided by the patient and medical records.    Past Medical History:  Diagnosis Date   Arthritis    in right knee   Asthma    not since 2014   Diabetes mellitus without complication (HCC)    GERD (gastroesophageal reflux disease)    History of bronchitis    Hyperlipidemia    Hypertension    Obesity     Patient Active Problem List   Diagnosis Date Noted   Annual physical exam 09/24/2024   Gastroesophageal reflux disease 11/13/2021   Complex tear of medial meniscus of right knee as current injury 04/04/2021   Hyperlipidemia associated with type 2 diabetes mellitus (HCC) 04/18/2020   Pain in limb 03/21/2020   Stasis dermatitis 03/21/2020   Hypertension associated with diabetes (HCC) 03/07/2020   Tobacco abuse 03/07/2020   Breast mass- right breast 9 oclock  03/07/2020   Type 2 diabetes mellitus with diabetic polyneuropathy, without long-term current use of insulin (HCC) 03/07/2020   Primary osteoarthritis of right knee 12/21/2018   Rotator cuff tendinitis, left 12/21/2018    Past Surgical History:  Procedure Laterality Date   BREAST BIOPSY Right 11/01/2020   U/S bx, ribbon marker, path pending   CATARACT EXTRACTION W/PHACO Right 10/07/2024   Procedure:  PHACOEMULSIFICATION, CATARACT, WITH IOL  4.30 00:31.2;  Surgeon: Enola Feliciano Hugger, MD;  Location: [facility];  Service: Ophthalmology;  Laterality: Right;   CATARACT EXTRACTION W/PHACO Left 10/14/2024   Procedure: PHACOEMULSIFICATION, CATARACT, WITH IOL INSERTION 5.72, 00:40.8;  Surgeon: Enola Feliciano Hugger, MD;  Location: [facility];  Service: Ophthalmology;  Laterality: Left;   CHOLECYSTECTOMY     COLONOSCOPY N/A 04/05/2022   Procedure: COLONOSCOPY;  Surgeon: Maryruth Ole DASEN, MD;  Location: [facility] ENDOSCOPY;  Service: Endoscopy;  Laterality: N/A;  DM   COLONOSCOPY WITH PROPOFOL  N/A 12/26/2017   Procedure: COLONOSCOPY WITH PROPOFOL ;  Surgeon: Viktoria Lamar DASEN, MD;  Location: [facility];  Service: Endoscopy;  Laterality: N/A;   ESOPHAGOGASTRODUODENOSCOPY N/A 04/05/2022   Procedure: ESOPHAGOGASTRODUODENOSCOPY (EGD);  Surgeon: Maryruth Ole DASEN, MD;  Location: [facility] ENDOSCOPY;  Service: Endoscopy;  Laterality: N/A;   KNEE ARTHROSCOPY Right 10/02/2022   Procedure: RIGHT KNEE ARTHROSCOPY WITH DEBRIDEMENT, PARTIAL MEDIAL AND LATERAL MENISCECTOMIES, AND REINJECTION OF FAT CELLS.;  Surgeon: Edie Norleen PARAS, MD;  Location: [facility] ORS;  Service: Orthopedics;  Laterality: Right;   KNEE CARTILAGE SURGERY Left 2000   KNEE SURGERY     slow the growth of right knee patient reports in 1979 and left knee cartilage repair in 2000   SALPINGOOPHORECTOMY Right     OB History   No obstetric history on file.      Home Medications    Prior to Admission medications  Medication Sig Start Date End  Date Taking? Authorizing Provider  albuterol  (VENTOLIN  HFA) 108 (90 Base) MCG/ACT inhaler Inhale 2 puffs into the lungs every 6 (six) hours as needed for wheezing or shortness of breath. 02/28/23  Yes Gretel App, NP  Continuous Glucose Sensor (DEXCOM G7 SENSOR) MISC Change every 10 days 10/05/24  Yes Vincente Saber, NP  FARXIGA  10 MG TABS tablet Take 1 tablet (10 mg total) by mouth  every morning. 09/30/24  Yes Kaur, Charanpreet, NP  insulin glargine  (LANTUS  SOLOSTAR) 100 UNIT/ML Solostar Pen Inject 12 Units into the skin at bedtime. 10/20/24  Yes Narendra, Nischal, MD  Insulin Pen Needle (PEN NEEDLES) 32G X 6 MM MISC Use to inject 10 units of Lantus  into the skin at bedtime. 09/28/24  Yes Kaur, Charanpreet, NP  methocarbamol  (ROBAXIN ) 500 MG tablet Take 1 tablet (500 mg total) by mouth 2 (two) times daily as needed for muscle spasms. 10/29/24  Yes Corlis Burnard DEL, NP  rosuvastatin  (CRESTOR ) 10 MG tablet Take 1 tablet (10 mg total) by mouth at bedtime. 09/26/24  Yes Kaur, Charanpreet, NP  Semaglutide ,0.25 or 0.5MG /DOS, 2 MG/3ML SOPN Inject 0.5 mg into the skin once a week. 09/30/24  Yes Kaur, Charanpreet, NP  metFORMIN (GLUCOPHAGE) 1000 MG tablet Take 1,000 mg by mouth 2 (two) times daily with a meal.  03/04/20  [provider]    Family History Family History  Problem Relation Age of Onset   Cancer Mother    Hypertension Mother    Hypertension Father    Cancer - Other Father    Congestive Heart Failure Father    Cancer Sister    Diabetes Sister    Hypertension Sister    Cancer Brother    Hypertension Brother    Diabetes Maternal Grandmother    Congestive Heart Failure Paternal Grandmother    Hypertension Paternal Grandfather    Diabetes Sister     Social History Social History[1]   Allergies   Penicillins, Lodine [etodolac], and Penicillin g   Review of Systems Review of Systems  Constitutional:  Negative for chills and fever.  Respiratory:  Negative for cough and shortness of breath.   Cardiovascular:  Negative for chest pain and palpitations.  Gastrointestinal:  Negative for abdominal pain, diarrhea, nausea and vomiting.  Genitourinary:  Negative for dysuria, flank pain and hematuria.  Musculoskeletal:  Positive for back pain. Negative for arthralgias, gait problem and joint swelling.  Skin:  Negative for color change, rash and wound.   Neurological:  Negative for weakness and numbness.     Physical Exam Triage Vital Signs ED Triage Vitals  Encounter Vitals Group     BP 10/29/24 1043 (!) 149/83     Girls Systolic BP Percentile --      Girls Diastolic BP Percentile --      Boys Systolic BP Percentile --      Boys Diastolic BP Percentile --      Pulse Rate 10/29/24 1043 74     Resp 10/29/24 1043 17     Temp 10/29/24 1043 98 F (36.7 C)     Temp Source 10/29/24 1043 Oral     SpO2 10/29/24 1043 96 %     Weight 10/29/24 1042 190 lb 9.4 oz (86.4 kg)     Height --      Head Circumference --      Peak Flow --      Pain Score 10/29/24 1042 1     Pain Loc --      Pain Education --  Exclude from Growth Chart --    No data found.  Updated Vital Signs BP (!) 149/83 (BP Location: Right Arm)   Pulse 74   Temp 98 F (36.7 C) (Oral)   Resp 17   Wt 190 lb 9.4 oz (86.4 kg)   LMP 10/02/2018   SpO2 96%   BMI 37.22 kg/m   Visual Acuity Right Eye Distance:   Left Eye Distance:   Bilateral Distance:    Right Eye Near:   Left Eye Near:    Bilateral Near:     Physical Exam Constitutional:      General: She is not in acute distress. HENT:     Mouth/Throat:     Mouth: Mucous membranes are moist.  Cardiovascular:     Rate and Rhythm: Normal rate.  Pulmonary:     Effort: Pulmonary effort is normal. No respiratory distress.  Abdominal:     General: Bowel sounds are normal.     Palpations: Abdomen is soft.     Tenderness: There is no abdominal tenderness. There is no right CVA tenderness, left CVA tenderness, guarding or rebound.  Musculoskeletal:        General: Tenderness present. No swelling or deformity. Normal range of motion.     Comments: Muscular tenderness of right lower back.  Skin:    Capillary Refill: Capillary refill takes less than 2 seconds.     Findings: No bruising, erythema, lesion or rash.  Neurological:     General: No focal deficit present.     Mental Status: She is alert.      Sensory: No sensory deficit.     Motor: No weakness.     Gait: Gait normal.      UC Treatments / Results  Labs (all labs ordered are listed, but only abnormal results are displayed) Labs Reviewed  POCT URINE DIPSTICK - Abnormal; Notable for the following components:      Result Value   Glucose, UA >=1,000 (*)    Blood, UA trace-intact (*)    All other components within normal limits  GLUCOSE, POCT (MANUAL RESULT ENTRY) - Abnormal; Notable for the following components:   POCT Glucose ([facility]) 204 (*)    All other components within normal limits    EKG   Radiology No results found.  Procedures Procedures (including critical care time)  Medications Ordered in UC Medications - No data to display  Initial Impression / Assessment and Plan / UC Course  I have reviewed the triage vital signs and the nursing notes.  Pertinent labs & imaging results that were available during my care of the patient were reviewed by me and considered in my medical decision making (see chart for details).    Glucosuria, right lower back pain without sciatica, type 2 diabetes with hyperglycemia.  Patient's most recent A1c was greater than 15.5.  She was started on insulin at bedtime and restarted on Farxiga  and Ozempic .  Her current blood sugar is 204 (not fasting).  Her urine is positive for glucose and trace blood.  No indication of infection in her urine.  Discussed the possibility of kidney stone and discussed the limitations of evaluation for this in an urgent care setting.  However, based on her symptoms and exam, I suspect this is muscular pain.  Treating today with methocarbamol ; precautions for drowsiness with this medication discussed.  Instructed her to follow-up with her PCP on Monday.  ED precautions given.  Education provided on back pain.  She agrees to plan  of care.  Final Clinical Impressions(s) / UC Diagnoses   Final diagnoses:  Glucosuria  Acute right-sided low back pain without  sciatica  Type 2 diabetes mellitus with hyperglycemia, with long-term current use of insulin (HCC)     Discharge Instructions      Take the muscle relaxer as needed for muscle spasm; Do not drive, operate machinery, or drink alcohol with this medication as it can cause drowsiness.   Your urine has glucose and blood today.  Your blood sugar is 204 (not fasting).    Follow up with your primary care provider on Monday.  Go to the emergency department if you have worsening symptoms.         ED Prescriptions     Medication Sig Dispense Auth. Provider   methocarbamol  (ROBAXIN ) 500 MG tablet Take 1 tablet (500 mg total) by mouth 2 (two) times daily as needed for muscle spasms. 10 tablet Corlis Burnard DEL, NP      I have reviewed the PDMP during this encounter.    [1]  Social History Tobacco Use   Smoking status: Every Day    Current packs/day: 0.25    Average packs/day: 0.6 packs/day for 84.0 years (51.7 ttl pk-yrs)    Types: E-cigarettes, Cigarettes    Start date: 11/04/1981   Smokeless tobacco: Never   Tobacco comments:    Reduced from 1 pack(20) a day to less than half pack(6) a day    Vaping Use   Vaping status: Every Day  Substance Use Topics   Alcohol use: Yes    Comment: rarely   Drug use: Yes    Types: Cocaine, Marijuana    Comment: 35 years ago     Corlis Burnard DEL, NP 10/29/24 1159  "

## 2024-10-29 NOTE — Telephone Encounter (Signed)
 FYI Only or Action Required?: FYI only for provider: recommended to UC or ED.  Patient was last seen in primary care on 10/20/2024 by Onesimo Claude, MD.  Called Nurse Triage reporting Flank Pain.  Symptoms began 2 weeks ago.  Interventions attempted: Rest, hydration, or home remedies.  Symptoms are: unchanged.  Triage Disposition: Go to ED Now (or PCP Triage)  Patient/caregiver understands and will follow disposition?: Yes  Copied from CRM [id number]. Topic: Clinical - Red Word Triage >> Oct 29, 2024  8:40 AM Alexandria E wrote: [facility] that prompted transfer to Nurse Triage: Right side kidney/lower back pain, going on for 2 weeks. Patient is not having any UTI symptoms. Reason for Disposition  Patient sounds very sick or weak to the triager  Answer Assessment - Initial Assessment Questions Reports right sided flank pain that started two weeks ago. Patient reports any movement causes severe pain. Patient is recommended to UC or ED.   1. LOCATION: Where does it hurt? (e.g., left, right)     Right side 2. ONSET: When did the pain start?     Started two weeks ago 3. SEVERITY: How bad is the pain? (e.g., Scale 1-10; mild, moderate, or severe)     Mild pain currently but any movement pain will go to moderate to severe 4. PATTERN: Does the pain come and go, or is it constant?      constant 5. CAUSE: What do you think is causing the pain?     unsure 6. OTHER SYMPTOMS:  Do you have any other symptoms? (e.g., fever, abdomen pain, vomiting, leg weakness, burning with urination, blood in urine)     no  Protocols used: Flank Pain-A-AH

## 2024-10-29 NOTE — Discharge Instructions (Addendum)
 Take the muscle relaxer as needed for muscle spasm; Do not drive, operate machinery, or drink alcohol with this medication as it can cause drowsiness.   Your urine has glucose and blood today.  Your blood sugar is 204 (not fasting).    Follow up with your primary care provider on Monday.  Go to the emergency department if you have worsening symptoms.

## 2024-10-29 NOTE — ED Triage Notes (Addendum)
 Sx 2 weeks Got worse 2 days  Lower right side back pain that radiates to her side.  Hurts worse when she bends over or breathes too deep.

## 2024-11-01 ENCOUNTER — Ambulatory Visit: Payer: Self-pay

## 2024-11-01 NOTE — Telephone Encounter (Signed)
 FYI Only or Action Required?: FYI only for provider: appointment scheduled on 11/03/2024.  Patient was last seen in primary care on 10/20/2024 by Onesimo Claude, MD.  Called Nurse Triage reporting Back Pain.  Symptoms began several weeks ago.  Interventions attempted: Prescription medications: robaxin .  Symptoms are: unchanged.  Triage Disposition: See PCP When Office is Open (Within 3 Days)  Patient/caregiver understands and will follow disposition?: Yes  Copied from CRM [id number]. Topic: Clinical - Red Word Triage >> Oct 29, 2024  8:40 AM Alexandria E wrote: [facility] that prompted transfer to Nurse Triage: Right side kidney/lower back pain, going on for 2 weeks. Patient is not having any UTI symptoms. >> Nov 01, 2024  8:52 AM Emylou G wrote: Santina to ER - they gave her muscle relaxers.. didn't work.. Pain in lower right side/back - hurts when she breathes ( any time she moves pain is at a 11-12 ) Reason for Disposition  [1] MODERATE back pain (e.g., interferes with normal activities) AND [2] present > 3 days  Answer Assessment - Initial Assessment Questions 1. ONSET: When did the pain begin? (e.g., minutes, hours, days)     Two week ago 2. LOCATION: Where does it hurt? (upper, mid or lower back)     Right low back pain 3. SEVERITY: How bad is the pain?  (e.g., Scale 1-10; mild, moderate, or severe)     2/10 with rest, 10/10 with activity 4. PATTERN: Is the pain constant? (e.g., yes, no; constant, intermittent)      constant 5. RADIATION: Does the pain shoot into your legs or somewhere else?     Radiates to flank above hip 6. CAUSE:  What do you think is causing the back pain?      unknown 7. BACK OVERUSE:  Any recent lifting of heavy objects, strenuous work or exercise?     denies 8. MEDICINES: What have you taken so far for the pain? (e.g., nothing, acetaminophen , NSAIDS)     Robaxin , not helpful. Toradol , minimally helpful 9. NEUROLOGIC SYMPTOMS: Do you  have any weakness, numbness, or problems with bowel/bladder control?     denies 10. OTHER SYMPTOMS: Do you have any other symptoms? (e.g., fever, abdomen pain, burning with urination, blood in urine)       denies  Protocols used: Back Pain-A-AH

## 2024-11-03 ENCOUNTER — Ambulatory Visit: Admitting: Family

## 2024-11-03 ENCOUNTER — Encounter: Payer: Self-pay | Admitting: Family

## 2024-11-03 VITALS — BP 130/70 | HR 84 | Temp 98.3°F | Ht 66.0 in | Wt 194.6 lb

## 2024-11-03 DIAGNOSIS — R10A1 Flank pain, right side: Secondary | ICD-10-CM | POA: Diagnosis not present

## 2024-11-03 DIAGNOSIS — R109 Unspecified abdominal pain: Secondary | ICD-10-CM

## 2024-11-03 DIAGNOSIS — Z7984 Long term (current) use of oral hypoglycemic drugs: Secondary | ICD-10-CM

## 2024-11-03 DIAGNOSIS — Z7985 Long-term (current) use of injectable non-insulin antidiabetic drugs: Secondary | ICD-10-CM | POA: Diagnosis not present

## 2024-11-03 DIAGNOSIS — E1142 Type 2 diabetes mellitus with diabetic polyneuropathy: Secondary | ICD-10-CM | POA: Diagnosis not present

## 2024-11-03 MED ORDER — MELOXICAM 7.5 MG PO TABS: 7.5000 mg | ORAL_TABLET | Freq: Every day | ORAL | 1 refills | Status: AC | PRN

## 2024-11-03 MED ORDER — DEXCOM G7 SENSOR MISC: 3 refills | Status: AC

## 2024-11-03 NOTE — Progress Notes (Signed)
 Nett ordered a STAT

## 2024-11-03 NOTE — Progress Notes (Signed)
 "  Assessment & Plan:  Right flank pain Assessment & Plan: Nontoxic in appearance.  No CVA tenderness.  Discussed superficial tenderness and likely musculoskeletal etiology.  Based on duration of symptoms, we agreed will rule out renal stone as appropriate.  Stat CT renal has been ordered.  Pending urinalysis, urine culture.  Provided meloxicam  to take for the next 10 to 14 days. Consider dedicated DG  lumbar.  Note: patient declines having CT renal today or tomorrow. She prefers to have after New years Day due to work schedule.   Orders: -     Meloxicam ; Take 1 tablet (7.5 mg total) by mouth daily as needed for pain.  Dispense: 30 tablet; Refill: 1 -     Dexcom G7 Sensor; Change every 10 days  Dispense: 4 each; Refill: 3 -     Urine Culture -     CT RENAL STONE STUDY; Future -     CBC with Differential/Platelet -     Comprehensive metabolic panel with GFR -     Urinalysis, Routine w reflex microscopic  Type 2 diabetes mellitus with diabetic polyneuropathy, without long-term current use of insulin (HCC) Assessment & Plan: Reviewed Dexcom Average blood glucose 205 GMI 8.2 No hypoglycemic episodes  Continue farxiga  10mg  , ozempic  0.5mg , lantus  12 units. Close follow up.    Abdominal pain, unspecified abdominal location -     CT RENAL STONE STUDY; Future  Other orders -     Extra Urine Specimen -     MICROSCOPIC MESSAGE     Return precautions given.   Risks, benefits, and alternatives of the medications and treatment plan prescribed today were discussed, and patient expressed understanding.   Education regarding symptom management and diagnosis given to patient on AVS either electronically or printed.  Return in about 1 month (around 12/04/2024).  [name], FNP  Subjective:    Patient ID: Kristin Cherry, female    DOB: 1966-10-16, 58 y.o.   MRN: [id number]  CC: Kristin Cherry is a 58 y.o. female who presents today for an acute visit.    HPI: HPI Discussed the  use of AI scribe software for clinical note transcription with the patient, who gave verbal consent to proceed.  History of Present Illness   Kristin Cherry is a 58 year old female who presents with right low back pain.  She has been experiencing right low back pain for approximately three weeks, waxes and wanes. The pain is primarily localized to one spot on the right side of her lower back and occasionally wraps around to the front of the abdomen. It does not radiate down her leg or across her back. The pain has been gradually worsening, prompting her to seek urgent care recently. She describes the pain as not being a shooting pain typical of muscle spasms, which she has experienced in the past. She has tried muscle relaxers, heat, and ice without relief. The pain is exacerbated by certain movements, such as bending over or taking deep breaths, and is somewhat alleviated when lying on her right side, although she is typically a left side sleeper.  She has a family history of kidney stones, particularly on her father's side, which concerns her given her current symptoms. She denies gross hematuria.   She is managing her blood sugar levels and is currently on Ozempic  and Lantus , which was recently increased to 12 units. Her average blood sugar is 205, with a GMI of 8.2, and she has not  had any hypoglycemic episodes. She has lost 40 pounds over the past year by watching her diet. She is trying to quit smoking.       She has taken mobic  in the past without hives.  She has taken ibuprofen , aleve  a lot without hives.    Seen for nonradiating right low back pain 10/29/2024.  Absent abdominal pain, dysuria, hematuria Urine significant for glucose greater than 1000, trace blood Given Robaxin   Office visit 10/20/2024 for uncontrolled diabetes.  Started back on Farxiga  10 mg daily, Ozempic  0.25 mg x 2 weeks.  Lantus  10 units every afternoon After 2 weeks increase Ozempic  to 0.5 mg, Lantus  12  units  Uncontrolled diabetes, A1c 15.5  Allergies: Penicillins, Lodine [etodolac], and Penicillin g Medications Ordered Prior to Encounter[1]  Review of Systems  Constitutional:  Negative for chills and fever.  Respiratory:  Negative for cough.   Cardiovascular:  Negative for chest pain and palpitations.  Gastrointestinal:  Negative for abdominal distention, abdominal pain, nausea and vomiting.  Genitourinary:  Positive for flank pain (right). Negative for difficulty urinating, dysuria, hematuria and pelvic pain.      Objective:    BP 130/70   Pulse 84   Temp 98.3 F (36.8 C) (Oral)   Ht 5' 6 (1.676 m)   Wt 194 lb 9.6 oz (88.3 kg)   LMP 10/02/2018   SpO2 97%   BMI 31.41 kg/m   BP Readings from Last 3 Encounters:  11/03/24 130/70  10/29/24 (!) 149/83  10/20/24 116/80   Wt Readings from Last 3 Encounters:  11/03/24 194 lb 9.6 oz (88.3 kg)  10/29/24 190 lb 9.4 oz (86.4 kg)  10/20/24 190 lb 9.6 oz (86.5 kg)    Physical Exam Vitals reviewed.  Constitutional:      Appearance: She is well-developed.  Eyes:     Conjunctiva/sclera: Conjunctivae normal.  Cardiovascular:     Rate and Rhythm: Normal rate and regular rhythm.     Pulses: Normal pulses.     Heart sounds: Normal heart sounds.  Pulmonary:     Effort: Pulmonary effort is normal.     Breath sounds: Normal breath sounds. No wheezing, rhonchi or rales.  Abdominal:     Tenderness: There is no right CVA tenderness or left CVA tenderness.  Musculoskeletal:       Arms:     Lumbar back: No swelling, edema, spasms, tenderness or bony tenderness. Normal range of motion.     Comments: Full range of motion with flexion, tension, lateral side bends. No bony tenderness. No pain, numbness, tingling elicited with single leg raise bilaterally.   Skin:    General: Skin is warm and dry.  Neurological:     Mental Status: She is alert.     Sensory: No sensory deficit.     Deep Tendon Reflexes:     Reflex Scores:       Patellar reflexes are 2+ on the right side and 2+ on the left side.    Comments: Sensation and strength intact bilateral lower extremities.  Psychiatric:        Speech: Speech normal.        Behavior: Behavior normal.        Thought Content: Thought content normal.           [1]  Current Outpatient Medications on File Prior to Visit  Medication Sig Dispense Refill   albuterol  (VENTOLIN  HFA) 108 (90 Base) MCG/ACT inhaler Inhale 2 puffs into the lungs every 6 (six) hours  as needed for wheezing or shortness of breath. 8 g 2   FARXIGA  10 MG TABS tablet Take 1 tablet (10 mg total) by mouth every morning. 30 tablet 1   insulin glargine  (LANTUS  SOLOSTAR) 100 UNIT/ML Solostar Pen Inject 12 Units into the skin at bedtime.     Insulin Pen Needle (PEN NEEDLES) 32G X 6 MM MISC Use to inject 10 units of Lantus  into the skin at bedtime. 100 each 0   methocarbamol  (ROBAXIN ) 500 MG tablet Take 1 tablet (500 mg total) by mouth 2 (two) times daily as needed for muscle spasms. 10 tablet 0   rosuvastatin  (CRESTOR ) 10 MG tablet Take 1 tablet (10 mg total) by mouth at bedtime. 90 tablet 0   Semaglutide ,0.25 or 0.5MG /DOS, 2 MG/3ML SOPN Inject 0.5 mg into the skin once a week. 3 mL 2   [DISCONTINUED] metFORMIN (GLUCOPHAGE) 1000 MG tablet Take 1,000 mg by mouth 2 (two) times daily with a meal.     No current facility-administered medications on file prior to visit.   "

## 2024-11-03 NOTE — Assessment & Plan Note (Addendum)
 Reviewed Dexcom Average blood glucose 205 GMI 8.2 No hypoglycemic episodes  Continue farxiga  10mg  , ozempic  0.5mg , lantus  12 units. Close follow up.

## 2024-11-03 NOTE — Patient Instructions (Signed)
 Start mobic  for 10-14 days for suspected musculoskeletal pain  CT renal ordered  A couple of points in regards to meloxicam  ( Mobic ) -  This medication is not intended for daily , long term use. It is a potent anti inflammatory ( NSAID), and my intention is for you take as needed for moderate to severe pain. If you find yourself using daily, please let me know.   Please takes Mobic  ( meloxicam ) with FOOD since it is an anti-inflammatory as it can cause a GI bleed or ulcer. If you have a history of GI bleed or ulcer, please do NOT take.  Do no take over the counter aleve , motrin , advil , goody's powder for pain as they are also NSAIDs, and they are  in the same class as Mobic   Lastly, we will need to monitor kidney function while on Mobic , and if we were to see any decline in kidney function in the future, we would have to discontinue this medication.    Congratulations on your hard work with your blood sugar!

## 2024-11-03 NOTE — Assessment & Plan Note (Addendum)
 Nontoxic in appearance.  No CVA tenderness.  Discussed superficial tenderness and likely musculoskeletal etiology.  Based on duration of symptoms, we agreed will rule out renal stone as appropriate.  Stat CT renal has been ordered.  Pending urinalysis, urine culture.  Provided meloxicam  to take for the next 10 to 14 days. Consider dedicated DG  lumbar.  Note: patient declines having CT renal today or tomorrow. She prefers to have after New years Day due to work schedule.

## 2024-11-05 ENCOUNTER — Ambulatory Visit
Admission: RE | Admit: 2024-11-05 | Discharge: 2024-11-05 | Disposition: A | Source: Ambulatory Visit | Attending: Family

## 2024-11-05 ENCOUNTER — Ambulatory Visit: Payer: Self-pay | Admitting: Family

## 2024-11-05 DIAGNOSIS — R109 Unspecified abdominal pain: Secondary | ICD-10-CM | POA: Insufficient documentation

## 2024-11-05 DIAGNOSIS — R10A1 Flank pain, right side: Secondary | ICD-10-CM | POA: Diagnosis present

## 2024-11-07 LAB — COMPREHENSIVE METABOLIC PANEL WITH GFR
AG Ratio: 1.6 (calc) (ref 1.0–2.5)
ALT: 26 U/L (ref 6–29)
AST: 24 U/L (ref 10–35)
Albumin: 4.9 g/dL (ref 3.6–5.1)
Alkaline phosphatase (APISO): 91 U/L (ref 37–153)
BUN: 11 mg/dL (ref 7–25)
CO2: 29 mmol/L (ref 20–32)
Calcium: 10 mg/dL (ref 8.6–10.4)
Chloride: 102 mmol/L (ref 98–110)
Creat: 0.53 mg/dL (ref 0.50–1.03)
Globulin: 3 g/dL (ref 1.9–3.7)
Glucose, Bld: 175 mg/dL — ABNORMAL HIGH (ref 65–99)
Potassium: 4.5 mmol/L (ref 3.5–5.3)
Sodium: 140 mmol/L (ref 135–146)
Total Bilirubin: 0.4 mg/dL (ref 0.2–1.2)
Total Protein: 7.9 g/dL (ref 6.1–8.1)
eGFR: 107 mL/min/1.73m2

## 2024-11-07 LAB — URINALYSIS, ROUTINE W REFLEX MICROSCOPIC
Bacteria, UA: NONE SEEN /HPF
Bilirubin Urine: NEGATIVE
Hgb urine dipstick: NEGATIVE
Hyaline Cast: NONE SEEN /LPF
Ketones, ur: NEGATIVE
Nitrite: NEGATIVE
Protein, ur: NEGATIVE
RBC / HPF: NONE SEEN /HPF (ref 0–2)
Specific Gravity, Urine: 1.017 (ref 1.001–1.035)
pH: 7 (ref 5.0–8.0)

## 2024-11-07 LAB — CBC WITH DIFFERENTIAL/PLATELET
Absolute Lymphocytes: 4074 {cells}/uL — ABNORMAL HIGH (ref 850–3900)
Absolute Monocytes: 788 {cells}/uL (ref 200–950)
Basophils Absolute: 56 {cells}/uL (ref 0–200)
Basophils Relative: 0.5 %
Eosinophils Absolute: 133 {cells}/uL (ref 15–500)
Eosinophils Relative: 1.2 %
HCT: 47 % — ABNORMAL HIGH (ref 35.9–46.0)
Hemoglobin: 15.2 g/dL (ref 11.7–15.5)
MCH: 29.1 pg (ref 27.0–33.0)
MCHC: 32.3 g/dL (ref 31.6–35.4)
MCV: 90 fL (ref 81.4–101.7)
MPV: 10.9 fL (ref 7.5–12.5)
Monocytes Relative: 7.1 %
Neutro Abs: 6050 {cells}/uL (ref 1500–7800)
Neutrophils Relative %: 54.5 %
Platelets: 272 Thousand/uL (ref 140–400)
RBC: 5.22 Million/uL — ABNORMAL HIGH (ref 3.80–5.10)
RDW: 12.4 % (ref 11.0–15.0)
Total Lymphocyte: 36.7 %
WBC: 11.1 Thousand/uL — ABNORMAL HIGH (ref 3.8–10.8)

## 2024-11-07 LAB — URINE CULTURE
MICRO NUMBER:: 17415308
Result:: NO GROWTH
SPECIMEN QUALITY:: ADEQUATE

## 2024-11-07 LAB — MICROSCOPIC MESSAGE

## 2024-11-07 LAB — EXTRA URINE SPECIMEN

## 2024-11-08 ENCOUNTER — Telehealth: Payer: Self-pay | Admitting: Family

## 2024-11-08 ENCOUNTER — Ambulatory Visit (INDEPENDENT_AMBULATORY_CARE_PROVIDER_SITE_OTHER): Admitting: Internal Medicine

## 2024-11-08 VITALS — BP 124/82 | HR 71 | Temp 98.2°F | Ht 66.0 in | Wt 192.2 lb

## 2024-11-08 DIAGNOSIS — Z7985 Long-term (current) use of injectable non-insulin antidiabetic drugs: Secondary | ICD-10-CM | POA: Diagnosis not present

## 2024-11-08 DIAGNOSIS — E1159 Type 2 diabetes mellitus with other circulatory complications: Secondary | ICD-10-CM

## 2024-11-08 DIAGNOSIS — E1142 Type 2 diabetes mellitus with diabetic polyneuropathy: Secondary | ICD-10-CM

## 2024-11-08 DIAGNOSIS — Z7984 Long term (current) use of oral hypoglycemic drugs: Secondary | ICD-10-CM | POA: Diagnosis not present

## 2024-11-08 DIAGNOSIS — I152 Hypertension secondary to endocrine disorders: Secondary | ICD-10-CM

## 2024-11-08 DIAGNOSIS — R10A1 Flank pain, right side: Secondary | ICD-10-CM

## 2024-11-08 MED ORDER — METHOCARBAMOL 500 MG PO TABS: 500.0000 mg | ORAL_TABLET | Freq: Two times a day (BID) | ORAL | 0 refills | Status: AC | PRN

## 2024-11-08 NOTE — Telephone Encounter (Signed)
 Fyi dr onesimo for re evaluation  She is having flank pain; ct renal unrevealing Advised re eval  Working on closing my note    Call pt Concerned with level of pain and no identifiable etiology Concern for musculoskeletal eval  Can come today?   Please keep appt with me to discuss 12/06/24 for incidental findings

## 2024-11-08 NOTE — Assessment & Plan Note (Signed)
-   Patient has an average blood sugar of 194 with a GMI of 7.9 on her glucometer -Will continue with Farxiga  10 mg daily, Ozempic  0.5 mg weekly, Lantus  12 units nightly -We discussed diet and importance of avoiding fried food and sweets -Patient states that she has been mostly compliant with diabetic diet -She will follow-up with her PCP next month -No further workup at this time

## 2024-11-08 NOTE — Assessment & Plan Note (Signed)
-   This problem is chronic and stable  - Patient is currently off any antihypertensive medications with a stable blood pressure of 124/82 today -Will continue to monitor -No further workup at this time

## 2024-11-08 NOTE — Patient Instructions (Addendum)
" °  VISIT SUMMARY: During your visit, we discussed your right lower back pain, diabetes management, and a benign adrenal gland tumor. We reviewed your symptoms, recent imaging results, and family history of musculoskeletal issues. We also discussed your current medications and dietary habits.  YOUR PLAN: -RIGHT LOWER BACK PAIN: Your right lower back pain is likely due to musculoskeletal issues, possibly muscle strain or inflammation. We recommend using Voltaren gel topically for pain relief and continuing Robaxin  for muscle relaxation. Avoid lifting heavy objects, especially your dog, to prevent aggravation. Heating pads can provide additional relief. If the pain persists or worsens, we may refer you to physical therapy for targeted exercises. You can also use Tylenol  for additional pain relief as needed.  -TYPE 2 DIABETES MELLITUS WITH DIABETIC POLYNEUROPATHY: Your diabetes management is improving, as indicated by your recent A1c reduction from your glucometer estimate from 8.2 to 7.9. Continue following your current diabetes management plan, including dietary modifications to avoid fried foods and sweets, and monitor your blood glucose levels regularly.  -BENIGN ADRENAL ADENOMA: Your benign adrenal adenoma is stable and does not require immediate intervention. We will continue to monitor it   INSTRUCTIONS: Please follow the recommendations for managing your right lower back pain, continue with your current diabetes management plan, and avoid lifting heavy objects. If your back pain persists or worsens, we may refer you to physical therapy. Continue monitoring your blood glucose levels regularly and follow up with periodic imaging for your adrenal adenoma as needed.                      Contains text generated by [employer]bridge.                                 Contains text generated by Abridge.   "

## 2024-11-08 NOTE — Assessment & Plan Note (Addendum)
-   Patient complains of right lower back pain over the last 4 weeks.  Pain worsens with movement and bending over - She has been seen by urgent care as well as her PCP - CT renal stone study showed no evidence of nephrolithiasis.  She did have a stable adrenal adenoma which we discussed today.  Patient was worried that this was the cause of her pain but explained that this is a chronic finding that has been unchanged in appearance and should not cause the pain that she has -She states that the Robaxin  did help with the pain but that the meloxicam  was not as useful -Patient states that IcyHot pads cause burning sensation and that heating pads provided more relief than icing -On exam, patient has mild to moderate transpiration over her right lower back.  No erythema or rash or increased warmth noted -I suspect the patient has a musculoskeletal pain which is exacerbated by her picking up her dogs -Robaxin  was refilled today.  Continue with conservative measures like heat to help with the pain.  Patient will also attempt to try Voltaren gel over the area to see if that will help with her pain -Was offered referral to physical therapy today but declined due to cost.  If pain worsens will refer patient to physical therapy for further evaluation -Patient will continue with meloxicam  daily.  As an alternative she can try ibuprofen  instead of the meloxicam .  Will also use Tylenol  as needed -Return precautions given to the patient -No further workup at this time

## 2024-11-08 NOTE — Progress Notes (Signed)
 "  Acute Office Visit  Subjective:     Patient ID: Kristin Cherry, female    DOB: 1966/04/05, 59 y.o.   MRN: [id number]  Chief Complaint  Patient presents with   Acute Visit    Right flank pain & discuss CT results    Discussed the use of AI scribe software for clinical note transcription with the patient, who gave verbal consent to proceed.  History of Present Illness Kristin Cherry is a 59 year old female with diabetes who presents with right lower back pain.  Right lower back pain - Duration of approximately four weeks - Dull pain that can become intense, described as 'a knife' when severe - Localized to right lower back and hip area, without radiation - Pain worsens with movement and bending over, such as when caring for dogs - Severe enough on a recent Saturday to consider going to the ER - Pain eased on Sunday but intensified again by 8 PM - IcyHot pads caused burning sensation; heating pad provided more relief than ice - No pain medication taken other than meloxicam , which was started on Wednesday and discontinued on Saturday due to increased pain; not resumed since - Robaxin  (muscle relaxer) provided some relief  Imaging and findings - Recent CT scan showed no stones - Benign adrenal gland tumor identified, reportedly seen on prior imaging (possibly MRI, as no prior CT scan)  Musculoskeletal history - Family history of arthritis: oldest sister with ankylosing spondylitis, other siblings with osteoarthritis  Diabetes mellitus - Improved glycemic control with GMI decreased from 8.2 to 7.9  - Currently on 12 units of Lantus  at night, Ozempic  0.5 mg, Farxiga  10 mg - Avoiding fried foods and sweets  Hyperlipidemia - Currently taking Crestor  for cholesterol management    Review of Systems  Constitutional: Negative.   HENT: Negative.    Respiratory: Negative.    Cardiovascular: Negative.   Gastrointestinal: Negative.   Musculoskeletal:  Positive for back  pain.  Neurological: Negative.   Psychiatric/Behavioral: Negative.          Objective:    BP 124/82   Pulse 71   Temp 98.2 F (36.8 C)   Ht 5' 6 (1.676 m)   Wt 192 lb 3.2 oz (87.2 kg)   LMP 10/02/2018   SpO2 97%   BMI 31.02 kg/m    Physical Exam Constitutional:      Appearance: Normal appearance.  HENT:     Head: Normocephalic and atraumatic.  Cardiovascular:     Rate and Rhythm: Normal rate and regular rhythm.     Heart sounds: Normal heart sounds.  Pulmonary:     Effort: Pulmonary effort is normal.     Breath sounds: Normal breath sounds. No wheezing, rhonchi or rales.  Musculoskeletal:        General: Tenderness present. No swelling.     Right lower leg: No edema.     Left lower leg: No edema.     Comments: Patient with mild to moderate tenderness to palpation over her right lower back.  No rash noted.  No ecchymosis.  No erythema or increased warmth  Neurological:     Mental Status: She is alert.  Psychiatric:        Mood and Affect: Mood normal.        Behavior: Behavior normal.     No results found for any visits on 11/08/24.      Assessment & Plan:   Problem List Items Addressed This Visit  Cardiovascular and Mediastinum   Hypertension associated with diabetes (HCC)   - This problem is chronic and stable  - Patient is currently off any antihypertensive medications with a stable blood pressure of 124/82 today -Will continue to monitor -No further workup at this time        Endocrine   Type 2 diabetes mellitus with diabetic polyneuropathy, without long-term current use of insulin (HCC)   - Patient has an average blood sugar of 194 with a GMI of 7.9 on her glucometer -Will continue with Farxiga  10 mg daily, Ozempic  0.5 mg weekly, Lantus  12 units nightly -We discussed diet and importance of avoiding fried food and sweets -Patient states that she has been mostly compliant with diabetic diet -She will follow-up with her PCP next month -No  further workup at this time      Relevant Medications   methocarbamol  (ROBAXIN ) 500 MG tablet     Other   Right flank pain - Primary   - Patient complains of right lower back pain over the last 4 weeks.  Pain worsens with movement and bending over - She has been seen by urgent care as well as her PCP - CT renal stone study showed no evidence of nephrolithiasis.  She did have a stable adrenal adenoma which we discussed today.  Patient was worried that this was the cause of her pain but explained that this is a chronic finding that has been unchanged in appearance and should not cause the pain that she has -She states that the Robaxin  did help with the pain but that the meloxicam  was not as useful -Patient states that IcyHot pads cause burning sensation and that heating pads provided more relief than icing -On exam, patient has mild to moderate transpiration over her right lower back.  No erythema or rash or increased warmth noted -I suspect the patient has a musculoskeletal pain which is exacerbated by her picking up her dogs -Robaxin  was refilled today.  Continue with conservative measures like heat to help with the pain.  Patient will also attempt to try Voltaren gel over the area to see if that will help with her pain -Was offered referral to physical therapy today but declined due to cost.  If pain worsens will refer patient to physical therapy for further evaluation -Patient will continue with meloxicam  daily.  As an alternative she can try ibuprofen  instead of the meloxicam .  Will also use Tylenol  as needed -Return precautions given to the patient -No further workup at this time      Relevant Medications   methocarbamol  (ROBAXIN ) 500 MG tablet    Meds ordered this encounter  Medications   methocarbamol  (ROBAXIN ) 500 MG tablet    Sig: Take 1 tablet (500 mg total) by mouth 2 (two) times daily as needed for muscle spasms.    Dispense:  10 tablet    Refill:  0    No follow-ups on  file.  Kristin Trice, MD   "

## 2024-11-22 NOTE — Addendum Note (Signed)
 Addended by: SEBASTIAN KNEE on: 11/22/2024 09:27 AM   Modules accepted: Orders

## 2024-11-30 ENCOUNTER — Other Ambulatory Visit: Payer: Self-pay | Admitting: Nurse Practitioner

## 2024-12-06 ENCOUNTER — Ambulatory Visit: Admitting: Family

## 2024-12-10 ENCOUNTER — Telehealth: Payer: Self-pay

## 2024-12-10 ENCOUNTER — Ambulatory Visit: Admitting: Family

## 2024-12-10 ENCOUNTER — Other Ambulatory Visit (HOSPITAL_COMMUNITY): Payer: Self-pay

## 2024-12-10 ENCOUNTER — Encounter: Payer: Self-pay | Admitting: Family

## 2024-12-10 VITALS — BP 132/82 | HR 77 | Temp 98.3°F | Ht 60.0 in | Wt 188.0 lb

## 2024-12-10 DIAGNOSIS — D3502 Benign neoplasm of left adrenal gland: Secondary | ICD-10-CM | POA: Insufficient documentation

## 2024-12-10 DIAGNOSIS — I700 Atherosclerosis of aorta: Secondary | ICD-10-CM | POA: Insufficient documentation

## 2024-12-10 DIAGNOSIS — K219 Gastro-esophageal reflux disease without esophagitis: Secondary | ICD-10-CM

## 2024-12-10 DIAGNOSIS — E1142 Type 2 diabetes mellitus with diabetic polyneuropathy: Secondary | ICD-10-CM

## 2024-12-10 DIAGNOSIS — R10A1 Flank pain, right side: Secondary | ICD-10-CM

## 2024-12-10 DIAGNOSIS — Z832 Family history of diseases of the blood and blood-forming organs and certain disorders involving the immune mechanism: Secondary | ICD-10-CM

## 2024-12-10 LAB — CBC WITH DIFFERENTIAL/PLATELET
Basophils Absolute: 0 10*3/uL (ref 0.0–0.1)
Basophils Relative: 0.4 % (ref 0.0–3.0)
Eosinophils Absolute: 0.1 10*3/uL (ref 0.0–0.7)
Eosinophils Relative: 1.2 % (ref 0.0–5.0)
HCT: 42.5 % (ref 36.0–46.0)
Hemoglobin: 14.2 g/dL (ref 12.0–15.0)
Lymphocytes Relative: 22.8 % (ref 12.0–46.0)
Lymphs Abs: 1.9 10*3/uL (ref 0.7–4.0)
MCHC: 33.5 g/dL (ref 30.0–36.0)
MCV: 88.2 fl (ref 78.0–100.0)
Monocytes Absolute: 0.6 10*3/uL (ref 0.1–1.0)
Monocytes Relative: 6.7 % (ref 3.0–12.0)
Neutro Abs: 5.7 10*3/uL (ref 1.4–7.7)
Neutrophils Relative %: 68.9 % (ref 43.0–77.0)
Platelets: 239 10*3/uL (ref 150.0–400.0)
RBC: 4.82 Mil/uL (ref 3.87–5.11)
RDW: 12.8 % (ref 11.5–15.5)
WBC: 8.2 10*3/uL (ref 4.0–10.5)

## 2024-12-10 LAB — LIPID PANEL
Cholesterol: 198 mg/dL (ref 28–200)
HDL: 37.4 mg/dL — ABNORMAL LOW
LDL Cholesterol: 123 mg/dL — ABNORMAL HIGH (ref 10–99)
NonHDL: 160.37
Total CHOL/HDL Ratio: 5
Triglycerides: 185 mg/dL — ABNORMAL HIGH (ref 10.0–149.0)
VLDL: 37 mg/dL (ref 0.0–40.0)

## 2024-12-10 LAB — SEDIMENTATION RATE: Sed Rate: 17 mm/h (ref 0–30)

## 2024-12-10 LAB — C-REACTIVE PROTEIN: CRP: <0.5 mg/dL — ABNORMAL LOW (ref 1.0–20.0)

## 2024-12-10 MED ORDER — SEMAGLUTIDE (1 MG/DOSE) 4 MG/3ML ~~LOC~~ SOPN: 1.0000 mg | PEN_INJECTOR | SUBCUTANEOUS | 3 refills | Status: AC

## 2024-12-10 MED ORDER — ESOMEPRAZOLE MAGNESIUM 20 MG PO PACK: 20.0000 mg | PACK | Freq: Every day | ORAL | 1 refills | Status: AC | PRN

## 2024-12-10 NOTE — Telephone Encounter (Signed)
 Pharmacy Patient Advocate Encounter   Received notification from [facility] KEY that prior authorization for Esomeprazole  Magnesium  20MG  packets is required/requested.   Insurance verification completed.   The patient is insured through [employer]   .   Per test claim: PA required; PA submitted to above mentioned insurance via Latent Key/confirmation #/EOC [id number] Status is pending

## 2024-12-10 NOTE — Assessment & Plan Note (Signed)
 Chronic.  CT renal 04/2019 with bilateral adrenal adenomas.  Adenomas do look stable.    Discussed need for hormone evaluation, continued surveillance.  Referral to endocrinology.

## 2024-12-10 NOTE — Patient Instructions (Addendum)
 Stop lantus  ( insulin ) entirely  Increase ozempic  to 1mg   Goal of fasting blood sugar is between 70-120. If in this range, we are reaching our target a1c ( goal 6.5%)   Please check fasting blood sugar in the morning time once or twice a week.  You may also check if you feel like you are having a low episode or particularly high episode of blood sugar.  If blood sugars increase, I may advise you to check blood sugar after your largest meal.  You specifically do this TWO hours after largest meal with the goal of being less than 180.  If blood sugar is checked sooner than 2 hours after largest meal, and it will be  expected to be elevated. You must wait 2 hours.   If your blood sugar is less than 180 hours after your largest meal, again we are reaching our target a1c goal   Call [facility] clinic if: BG < 70 or > 300.   If you have any symptoms of low blood sugar ( sweating, shakiness, lightheaded, dizzy) that you notify me. If you have a low, please drink a glass of orange juice and recheck blood sugar every 5 minutes until you dont feel symptomatic AND blood sugar is above 80.

## 2024-12-10 NOTE — Assessment & Plan Note (Signed)
 No alarm sxs at this time.  Refill provided for Nexium  20mg  prn.

## 2024-12-10 NOTE — Assessment & Plan Note (Signed)
 Resolved. She declines further evaluation including dedicated imaging, PT referral at this time. Will follow.

## 2024-12-10 NOTE — Progress Notes (Signed)
 "  Assessment & Plan:  Bilateral adrenal adenomas Assessment & Plan: Chronic.  CT renal 04/2019 with bilateral adrenal adenomas.  Adenomas do look stable.    Discussed need for hormone evaluation, continued surveillance.  Referral to endocrinology.  Orders: -     Ambulatory referral to Endocrinology  Right flank pain Assessment & Plan: Resolved. She declines further evaluation including dedicated imaging, PT referral at this time. Will follow.    Orders: -     CBC with Differential/Platelet  Family history of autoimmune disorder -     Ambulatory referral to Endocrinology -     ANA -     C-reactive protein -     CYCLIC CITRUL PEPTIDE ANTIBODY, IGG/IGA -     Rheumatoid factor -     Sedimentation rate  Atherosclerosis of aorta Assessment & Plan: Discussed LDL < 70. Pending lipid panel. Continue crestor  10mg   No family h/o ASCVD.  Orders: -     Lipid panel  Type 2 diabetes mellitus with diabetic polyneuropathy, without long-term current use of insulin (HCC) Assessment & Plan: Reviewed Dexcom  GMI 7.2% average 164 No hypoglycemic episode.  Patient desires to come off of insulin and increase Ozempic  for additional benefit of weight loss.  Counseled on blackbox warning, mechanism of action, and side effects.   stop Lantus  10 units.  Increase Ozempic  to 1 mg.  Continue Farxiga  10 mg  Orders: -     Lipid panel -     Semaglutide  (1 MG/DOSE); Inject 1 mg as directed once a week.  Dispense: 3 mL; Refill: 3  Gastroesophageal reflux disease, unspecified whether esophagitis present Assessment & Plan: No alarm sxs at this time.  Refill provided for Nexium  20mg  prn.  Orders: -     Esomeprazole  Magnesium ; Take 20 mg by mouth daily as needed.  Dispense: 30 each; Refill: 1     Return precautions given.   Risks, benefits, and alternatives of the medications and treatment plan prescribed today were discussed, and patient expressed understanding.   Education regarding symptom  management and diagnosis given to patient on AVS either electronically or printed.  Return in about 2 months (around 02/07/2025).  [name], FNP  Subjective:    Patient ID: Kristin Cherry, female    DOB: Mar 31, 1966, 59 y.o.   MRN: [id number]  CC: Kristin Cherry is a 59 y.o. female who presents today for follow up.   HPI: HPI Follow-up right flank plain 11/03/2024 Discussed the use of AI scribe software for clinical note transcription with the patient, who gave verbal consent to proceed.  History of Present Illness   Kristin Cherry is a 59 year old female who presents for follow-up and review of CT renal findings.  She has experienced significant improvement in her back pain . The pain is now minimal, with only occasional 'twinges' during movement. She uses a heating pad and maintains regular movement and stretching to prevent discomfort.  Family history autoimmune disease, specifically rheumatoid arthritis, ankylosing spondylitis  She manages her diabetes with a Dexcom for monitoring, Ozempic  0.5 mg, and has reduced her insulin (Lantus ) to 10 units at bedtime due to low readings in 70's. Her A1c has been improving, and she is satisfied with her current diabetes management.  She has a history of GERD, previously managed with Nexium . She recently experienced a recurrence of episodic burping and has been using over-the-counter Nexium . Denies feeling of mass in the neck, dysphagia, dyspnea,  hoarseness   She is  on Crestor  10 mg at night for cholesterol management due to a history of elevated LDL cholesterol. She is not regularly exercising but plans to resume using her exercise bike, which she previously used for five miles a day before knee issues.       CT renal 11/05/2024 There is a stable 1.5 x 2.1 cm left adrenal adenoma. There is also stable smaller right adrenal adenoma. No suspicious renal mass within the limitations of this unenhanced exam. No  nephroureterolithiasis or obstructive uropathy. Distended urinary bladder exhibiting mild asymmetric thickening of the anterior bladder wall, grossly similar to the prior study and likely sequela of chronic cystitis. mild peripheral atherosclerotic vascular calcifications of the aorta  Subsequent acute visit with Dr. Narendra 11/08/2024 for right flank pain.  Reviewed CT renal.  Continue on Robaxin   LDL 123 09/24/24  Compliant with ozempic  0.5mg , lantus  10 units , farxiga  10mg  every day  Denies hypoglycemic episodes.  Continuation GLP agonist / GIP:  Denies family history medullary thyroid  cancer, multiple endocrine neoplasia. Denies personal history of pancreatitis, multiple endocrine neoplasia, chronic constipation, history of ileus.   Eye exam is up-to-date.  No known history of retinopathy.   Normal esophagus on EGD 04/05/22 Dr Maryruth Colonoscopy 04/05/22 repeat in 5 years Allergies: Penicillins, Lodine [etodolac], and Penicillin g Medications Ordered Prior to Encounter[1]  Review of Systems  Constitutional:  Negative for chills and fever.  Respiratory:  Negative for cough.   Cardiovascular:  Negative for chest pain and palpitations.  Gastrointestinal:  Negative for abdominal pain, nausea and vomiting.      Objective:    BP 132/82   Pulse 77   Temp 98.3 F (36.8 C) (Oral)   Ht 5' (1.524 m)   Wt 188 lb (85.3 kg)   LMP 10/02/2018   SpO2 98%   BMI 36.72 kg/m  BP Readings from Last 3 Encounters:  12/10/24 132/82  11/08/24 124/82  11/03/24 130/70   Wt Readings from Last 3 Encounters:  12/10/24 188 lb (85.3 kg)  11/08/24 192 lb 3.2 oz (87.2 kg)  11/03/24 194 lb 9.6 oz (88.3 kg)    Physical Exam         [1]  Current Outpatient Medications on File Prior to Visit  Medication Sig Dispense Refill   albuterol  (VENTOLIN  HFA) 108 (90 Base) MCG/ACT inhaler Inhale 2 puffs into the lungs every 6 (six) hours as needed for wheezing or shortness of breath. 8 g 2    Continuous Glucose Sensor (DEXCOM G7 SENSOR) MISC Change every 10 days 4 each 3   FARXIGA  10 MG TABS tablet TAKE 1 TABLET BY MOUTH EVERY MORNING 30 tablet 1   Insulin Pen Needle (PEN NEEDLES) 32G X 6 MM MISC Use to inject 10 units of Lantus  into the skin at bedtime. 100 each 0   methocarbamol  (ROBAXIN ) 500 MG tablet Take 1 tablet (500 mg total) by mouth 2 (two) times daily as needed for muscle spasms. 10 tablet 0   rosuvastatin  (CRESTOR ) 10 MG tablet Take 1 tablet (10 mg total) by mouth at bedtime. 90 tablet 0   meloxicam  (MOBIC ) 7.5 MG tablet Take 1 tablet (7.5 mg total) by mouth daily as needed for pain. (Patient not taking: Reported on 12/10/2024) 30 tablet 1   [DISCONTINUED] metFORMIN (GLUCOPHAGE) 1000 MG tablet Take 1,000 mg by mouth 2 (two) times daily with a meal.     No current facility-administered medications on file prior to visit.   "

## 2024-12-10 NOTE — Assessment & Plan Note (Addendum)
 Reviewed Dexcom  GMI 7.2% average 164 No hypoglycemic episode.  Patient desires to come off of insulin and increase Ozempic  for additional benefit of weight loss.  Counseled on blackbox warning, mechanism of action, and side effects.   stop Lantus  10 units.  Increase Ozempic  to 1 mg.  Continue Farxiga  10 mg

## 2024-12-10 NOTE — Assessment & Plan Note (Signed)
 Discussed LDL < 70. Pending lipid panel. Continue crestor  10mg   No family h/o ASCVD.

## 2025-01-20 ENCOUNTER — Encounter: Admitting: Nurse Practitioner

## 2025-02-08 ENCOUNTER — Ambulatory Visit: Admitting: Nurse Practitioner
